# Patient Record
Sex: Female | Born: 1990 | Race: White | Hispanic: Yes | Marital: Single | Smoking: Former smoker
Health system: Southern US, Community
[De-identification: ages and names within clinical notes are randomized; demographics above are authoritative.]

## PROBLEM LIST (undated history)

## (undated) ENCOUNTER — Inpatient Hospital Stay: Payer: Self-pay

## (undated) DIAGNOSIS — O99212 Obesity complicating pregnancy, second trimester: Secondary | ICD-10-CM

## (undated) DIAGNOSIS — O039 Complete or unspecified spontaneous abortion without complication: Secondary | ICD-10-CM

## (undated) DIAGNOSIS — D649 Anemia, unspecified: Secondary | ICD-10-CM

## (undated) HISTORY — PX: WISDOM TOOTH EXTRACTION: SHX21

## (undated) HISTORY — PX: CHOLECYSTECTOMY: SHX55

## (undated) HISTORY — PX: CYSTECTOMY: SUR359

## (undated) HISTORY — DX: Complete or unspecified spontaneous abortion without complication: O03.9

## (undated) HISTORY — DX: Anemia, unspecified: D64.9

---
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
## 2009-07-09 ENCOUNTER — Emergency Department: Payer: Self-pay | Admitting: Emergency Medicine

## 2009-08-01 ENCOUNTER — Emergency Department: Payer: Self-pay | Admitting: Emergency Medicine

## 2010-09-09 ENCOUNTER — Emergency Department: Payer: Self-pay | Admitting: Emergency Medicine

## 2010-11-18 ENCOUNTER — Emergency Department: Payer: Self-pay | Admitting: Emergency Medicine

## 2011-01-10 ENCOUNTER — Observation Stay: Payer: Self-pay | Admitting: Obstetrics and Gynecology

## 2011-02-12 ENCOUNTER — Inpatient Hospital Stay: Payer: Self-pay

## 2012-04-13 ENCOUNTER — Emergency Department: Payer: Self-pay | Admitting: *Deleted

## 2012-04-13 LAB — URINALYSIS, COMPLETE
Glucose,UR: NEGATIVE mg/dL (ref 0–75)
Nitrite: NEGATIVE
Ph: 5 (ref 4.5–8.0)
Specific Gravity: 1.032 (ref 1.003–1.030)
WBC UR: 443 /HPF (ref 0–5)

## 2012-04-13 LAB — PREGNANCY, URINE: Pregnancy Test, Urine: NEGATIVE m[IU]/mL

## 2013-08-10 ENCOUNTER — Emergency Department: Payer: Self-pay | Admitting: Emergency Medicine

## 2013-08-10 LAB — CBC
HCT: 38.7 % (ref 35.0–47.0)
MCH: 29.8 pg (ref 26.0–34.0)
MCHC: 35 g/dL (ref 32.0–36.0)
MCV: 85 fL (ref 80–100)
WBC: 9.4 10*3/uL (ref 3.6–11.0)

## 2013-08-10 LAB — BASIC METABOLIC PANEL
Anion Gap: 6 — ABNORMAL LOW (ref 7–16)
Calcium, Total: 8.8 mg/dL (ref 8.5–10.1)
Chloride: 105 mmol/L (ref 98–107)
Co2: 27 mmol/L (ref 21–32)
Creatinine: 0.52 mg/dL — ABNORMAL LOW (ref 0.60–1.30)
EGFR (Non-African Amer.): >60
Osmolality: 275 (ref 275–301)
Potassium: 3.6 mmol/L (ref 3.5–5.1)

## 2013-08-10 LAB — TROPONIN I: Troponin-I: <0.02 ng/mL

## 2013-08-11 LAB — CK: CK, Total: 74 U/L (ref 21–215)

## 2013-10-23 ENCOUNTER — Emergency Department: Payer: Self-pay | Admitting: Emergency Medicine

## 2014-01-10 ENCOUNTER — Emergency Department: Payer: Self-pay | Admitting: Emergency Medicine

## 2014-03-07 ENCOUNTER — Emergency Department: Payer: Self-pay | Admitting: Emergency Medicine

## 2014-03-07 LAB — COMPREHENSIVE METABOLIC PANEL
ALBUMIN: 3.7 g/dL (ref 3.4–5.0)
ALK PHOS: 92 U/L
AST: 15 U/L (ref 15–37)
Anion Gap: 6 — ABNORMAL LOW (ref 7–16)
BILIRUBIN TOTAL: 0.3 mg/dL (ref 0.2–1.0)
BUN: 14 mg/dL (ref 7–18)
CO2: 27 mmol/L (ref 21–32)
CREATININE: 0.58 mg/dL — AB (ref 0.60–1.30)
Calcium, Total: 8.6 mg/dL (ref 8.5–10.1)
Chloride: 107 mmol/L (ref 98–107)
Glucose: 111 mg/dL — ABNORMAL HIGH (ref 65–99)
Osmolality: 281 (ref 275–301)
POTASSIUM: 3.7 mmol/L (ref 3.5–5.1)
SGPT (ALT): 25 U/L (ref 12–78)
Sodium: 140 mmol/L (ref 136–145)
TOTAL PROTEIN: 7.2 g/dL (ref 6.4–8.2)

## 2014-03-07 LAB — URINALYSIS, COMPLETE
BACTERIA: NONE SEEN
Bilirubin,UR: NEGATIVE
GLUCOSE, UR: NEGATIVE mg/dL (ref 0–75)
Ketone: NEGATIVE
Nitrite: NEGATIVE
PH: 6 (ref 4.5–8.0)
PROTEIN: NEGATIVE
RBC,UR: 1 /HPF (ref 0–5)
Specific Gravity: 1.024 (ref 1.003–1.030)

## 2014-03-07 LAB — CBC
HCT: 40.3 % (ref 35.0–47.0)
HGB: 13.9 g/dL (ref 12.0–16.0)
MCH: 30 pg (ref 26.0–34.0)
MCHC: 34.5 g/dL (ref 32.0–36.0)
MCV: 87 fL (ref 80–100)
Platelet: 347 10*3/uL (ref 150–440)
RBC: 4.63 10*6/uL (ref 3.80–5.20)
RDW: 13.8 % (ref 11.5–14.5)
WBC: 9.3 10*3/uL (ref 3.6–11.0)

## 2014-03-07 LAB — LIPASE, BLOOD: LIPASE: 125 U/L (ref 73–393)

## 2014-08-27 ENCOUNTER — Emergency Department: Payer: Self-pay | Admitting: Emergency Medicine

## 2014-09-11 DIAGNOSIS — O99212 Obesity complicating pregnancy, second trimester: Secondary | ICD-10-CM

## 2014-09-11 HISTORY — DX: Obesity complicating pregnancy, second trimester: O99.212

## 2014-09-21 ENCOUNTER — Ambulatory Visit: Payer: Self-pay | Admitting: Obstetrics and Gynecology

## 2014-09-21 LAB — COMPREHENSIVE METABOLIC PANEL
ALBUMIN: 3.5 g/dL (ref 3.4–5.0)
ANION GAP: 6 — AB (ref 7–16)
Alkaline Phosphatase: 97 U/L
BILIRUBIN TOTAL: 0.2 mg/dL (ref 0.2–1.0)
BUN: 13 mg/dL (ref 7–18)
Calcium, Total: 8.7 mg/dL (ref 8.5–10.1)
Chloride: 107 mmol/L (ref 98–107)
Co2: 27 mmol/L (ref 21–32)
Creatinine: 0.55 mg/dL — ABNORMAL LOW (ref 0.60–1.30)
EGFR (African American): >60
EGFR (Non-African Amer.): >60
Glucose: 107 mg/dL — ABNORMAL HIGH (ref 65–99)
Osmolality: 280 (ref 275–301)
POTASSIUM: 3.7 mmol/L (ref 3.5–5.1)
SGOT(AST): 18 U/L (ref 15–37)
SGPT (ALT): 24 U/L
SODIUM: 140 mmol/L (ref 136–145)
TOTAL PROTEIN: 6.8 g/dL (ref 6.4–8.2)

## 2014-09-21 LAB — PREGNANCY, URINE: PREGNANCY TEST, URINE: NEGATIVE m[IU]/mL

## 2014-09-24 ENCOUNTER — Ambulatory Visit: Payer: Self-pay | Admitting: Obstetrics and Gynecology

## 2014-11-04 ENCOUNTER — Emergency Department: Payer: Self-pay | Admitting: Emergency Medicine

## 2015-01-10 NOTE — Op Note (Signed)
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   POSTOPERATIVE DIAGNOSIS: Chronic Left Bartholin gland cyst.   PROCEDURE:  Bartholin gland marsupialization.   ANESTHESIA: General.   SURGEON: Thomasene MohairStephen Irisa Grimsley, MD.   ESTIMATED BLOOD LOSS: 30 mL.   OPERATIVE FLUIDS: 40 mL crystalloid.   COMPLICATIONS: None.   FINDINGS:  Left Bartholin gland cyst, 3 x 2 cm, no purulence to fluid.   SPECIMENS: None.   CONDITION AT END OF PROCEDURE: Stable.   PROCEDURE IN DETAIL: The patient was taken to the operating room where general anesthesia was administered and found to be adequate. The patient was placed in the dorsal supine high lithotomy position in candy cane stirrups and prepped and draped in the usual sterile fashion. After a timeout was called the left Bartholin gland cyst was identified and marked across the line of incision. A scalpel was used to open the overlying skin. The overlying skin was tagged with Allis clamps on either side of the cyst. The cyst wall was then opened and using 2-0 Vicryl interrupted sutures in a circumferential, interrupted fashion the epithelium of the cyst wall was sutured to the surrounding vulvar skin. Approximately 4 mL of 0.5% Sensorcaine plain was injected around the area of operation at the end of the procedure. Hemostasis was noted.   The patient tolerated the procedure well. Sponge, lap, and needle counts were correct x 2. For VTE prophylaxis the patient was wearing pneumatic compression stockings throughout the entire procedure. She was awakened in the operating room and taken to the recovery area in stable condition.    ____________________________ Conard NovakStephen D. Navya Timmons, MD sdj:bu D: 09/24/2014 09:56:50 ET T: 09/24/2014 13:14:23 ET JOB#: [id number]  cc: Conard NovakStephen D. [place] Doster, MD, <Dictator> Conard NovakSTEPHEN D Vi Whitesel MD ELECTRONICALLY SIGNED  10/14/2014 0:01

## 2015-02-18 ENCOUNTER — Emergency Department
Admission: EM | Admit: 2015-02-18 | Discharge: 2015-02-18 | Disposition: A | Discharge status: Home / Self Care | Payer: Self-pay | Attending: Emergency Medicine | Admitting: Emergency Medicine

## 2015-02-18 ENCOUNTER — Encounter: Payer: Self-pay | Admitting: Emergency Medicine

## 2015-02-18 DIAGNOSIS — M5441 Lumbago with sciatica, right side: Secondary | ICD-10-CM | POA: Insufficient documentation

## 2015-02-18 DIAGNOSIS — R1030 Lower abdominal pain, unspecified: Secondary | ICD-10-CM | POA: Insufficient documentation

## 2015-02-18 DIAGNOSIS — Z3202 Encounter for pregnancy test, result negative: Secondary | ICD-10-CM | POA: Insufficient documentation

## 2015-02-18 LAB — COMPREHENSIVE METABOLIC PANEL
ALT: 28 U/L (ref 14–54)
ANION GAP: 8 (ref 5–15)
AST: 23 U/L (ref 15–41)
Albumin: 4.3 g/dL (ref 3.5–5.0)
Alkaline Phosphatase: 102 U/L (ref 38–126)
BUN: 10 mg/dL (ref 6–20)
CALCIUM: 9.1 mg/dL (ref 8.9–10.3)
CO2: 26 mmol/L (ref 22–32)
CREATININE: 0.57 mg/dL (ref 0.44–1.00)
Chloride: 103 mmol/L (ref 101–111)
GFR calc Af Amer: >60 mL/min (ref >60)
Glucose, Bld: 89 mg/dL (ref 65–99)
Potassium: 3.5 mmol/L (ref 3.5–5.1)
Sodium: 137 mmol/L (ref 135–145)
Total Bilirubin: 0.4 mg/dL (ref 0.3–1.2)
Total Protein: 7.9 g/dL (ref 6.5–8.1)

## 2015-02-18 LAB — URINALYSIS COMPLETE WITH MICROSCOPIC (ARMC ONLY)
BACTERIA UA: NONE SEEN
Bilirubin Urine: NEGATIVE
Glucose, UA: NEGATIVE mg/dL
HGB URINE DIPSTICK: NEGATIVE
KETONES UR: NEGATIVE mg/dL
NITRITE: NEGATIVE
PROTEIN: 30 mg/dL — AB
Specific Gravity, Urine: 1.03 (ref 1.005–1.030)
pH: 6 (ref 5.0–8.0)

## 2015-02-18 LAB — CBC WITH DIFFERENTIAL/PLATELET
BASOS ABS: 0 10*3/uL (ref 0–0.1)
BASOS PCT: 1 %
Eosinophils Absolute: 0.2 10*3/uL (ref 0–0.7)
Eosinophils Relative: 2 %
HCT: 38.1 % (ref 35.0–47.0)
HEMOGLOBIN: 13.4 g/dL (ref 12.0–16.0)
Lymphocytes Relative: 17 %
Lymphs Abs: 1.7 10*3/uL (ref 1.0–3.6)
MCH: 30 pg (ref 26.0–34.0)
MCHC: 35.1 g/dL (ref 32.0–36.0)
MCV: 85.4 fL (ref 80.0–100.0)
Monocytes Absolute: 0.6 10*3/uL (ref 0.2–0.9)
Monocytes Relative: 6 %
NEUTROS ABS: 7.5 10*3/uL — AB (ref 1.4–6.5)
NEUTROS PCT: 74 %
Platelets: 339 10*3/uL (ref 150–440)
RBC: 4.46 MIL/uL (ref 3.80–5.20)
RDW: 13.9 % (ref 11.5–14.5)
WBC: 10 10*3/uL (ref 3.6–11.0)

## 2015-02-18 LAB — POCT PREGNANCY, URINE: PREG TEST UR: NEGATIVE

## 2015-02-18 MED ORDER — IBUPROFEN 600 MG PO TABS
ORAL_TABLET | ORAL | Status: AC
  Administered 2015-02-18: 600 mg via ORAL
  Filled 2015-02-18: qty 1

## 2015-02-18 MED ORDER — IBUPROFEN 400 MG PO TABS
600.0000 mg | ORAL_TABLET | Freq: Once | ORAL | Status: AC
  Administered 2015-02-18: 600 mg via ORAL

## 2015-02-18 MED ORDER — TRAMADOL HCL 50 MG PO TABS: 50.0000 mg | ORAL_TABLET | Freq: Four times a day (QID) | ORAL | Status: DC | PRN

## 2015-02-18 NOTE — ED Notes (Signed)
Pt comes in c/o lower abdominal pain that radiated into her lower back.  Patient states she had nausea 2 days ago when it started.  Denies vomited, diarrhea, fevers, and chills.  States she is having normal BM's and states she had these same symptoms as when she was diagnosed with gastritis a couple of years ago.

## 2015-02-18 NOTE — ED Provider Notes (Signed)
[facility] Emergency Department Provider Note  Time seen: 7:15 PM  I have reviewed the triage vital signs and the nursing notes.   HISTORY  Chief Complaint Abdominal Pain and Back Pain    HPI Madeline Todd is a 24 y.o. female with no medical history presents the emergency department with complaints of lower abdominal pain, as well as lower back pain which radiates to her right leg. Going to the patient for the past 2-3 days she has been experiencing lower back pain which radiates to her right leg, worse with movement, twisting, or bending over and standing up. He states also for the last 24 hours she has beenpacing lower abdominal pain. She denies any vaginal bleeding or discharge, denies dysuria, hematuria, nausea/vomiting/diarrhea. States normal bowel movements. Patient describes her lower abdominal pain as mild, dull/aching. Describes her lower back pain as moderate and sharp.     History reviewed. No pertinent past medical history.  There are no active problems to display for this patient.   Past Surgical History  Procedure Laterality Date  . Cystectomy      vaginal cyst    No current outpatient prescriptions on file.  Allergies Review of patient's allergies indicates no known allergies.  No family history on file.  Social History History  Substance Use Topics  . Smoking status: Never Smoker   . Smokeless tobacco: Not on file  . Alcohol Use: No    Review of Systems Constitutional: Negative for fever. Cardiovascular: Negative for chest pain. Respiratory: Negative for shortness of breath. Gastrointestinal: Positive for lower abdominal pain. Negative for nausea/vomiting/diarrhea. Genitourinary: Negative for dysuria. Negative for hematuria. Musculoskeletal: Positive for lower back pain. Neurological: Negative for headache  10-point ROS otherwise negative.  ____________________________________________   PHYSICAL  EXAM:  VITAL SIGNS: ED Triage Vitals  Enc Vitals Group     BP 02/18/15 1834 136/76 mmHg     Pulse Rate 02/18/15 1834 92     Resp 02/18/15 1834 18     Temp 02/18/15 1834 98.2 F (36.8 C)     Temp Source 02/18/15 1834 Oral     SpO2 02/18/15 1834 99 %     Weight 02/18/15 1834 236 lb (107.049 kg)     Height 02/18/15 1834 5\' 2"  (1.575 m)     Head Cir --      Peak Flow --      Pain Score 02/18/15 1835 6     Pain Loc --      Pain Edu? --      Excl. in GC? --     Constitutional: Alert and oriented. Well appearing and in no distress ENT   Head: Normocephalic and atraumatic.   Mouth/Throat: Mucous membranes are moist. Cardiovascular: Normal rate, regular rhythm. No murmur Respiratory: Normal respiratory effort without tachypnea nor retractions. Breath sounds are clear Gastrointestinal: Soft, mild suprapubic tenderness to palpation. No rebound or guarding. No CVA tenderness to palpation. Musculoskeletal: Moderate L-spine/paraspinal tenderness palpation. No CVA tenderness palpation. Neurologic:  Normal speech and language. No gross focal neurologic deficits Skin:  Skin is warm, dry and intact.  Psychiatric: Mood and affect are normal. Speech and behavior are normal.  ____________________________________________    INITIAL IMPRESSION / ASSESSMENT AND PLAN / ED COURSE  Pertinent labs & imaging results that were available during my care of the patient were reviewed by me and considered in my medical decision making (see chart for details).  Patient presents the emergency department with various complaints. Her back pain  appears most consistent with musculoskeletal pain, however patient denies any inciting event. 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 It is unclear the origin of the patient's abdominal pain at this time. She denies any vaginal bleeding or  discharge. Denies dysuria or hematuria. Patient appears to be tender in the suprapubic area. We will check labs and reevaluate. The patient has not taken any over-the-counter medications for relief, we will treat with Motrin all the away lab results.  Labs are largely within normal limits. We will discharge the patient home on Ultram as needed, and follow up with a primary care physician.  ____________________________________________   FINAL CLINICAL IMPRESSION(S) / ED DIAGNOSES  Lower back pain Lower abdominal pain   Minna Antis, MD 02/18/15 2009

## 2015-02-18 NOTE — ED Notes (Signed)
MD at bedside. 

## 2015-02-18 NOTE — Discharge Instructions (Signed)
Back Pain, Adult Back pain is very common. The pain often gets better over time. The cause of back pain is usually not dangerous. Most people can learn to manage their back pain on their own.  HOME CARE   Stay active. Start with short walks on flat ground if you can. Try to walk farther each day.  Do not sit, drive, or stand in one place for more than 30 minutes. Do not stay in bed.  Do not avoid exercise or work. Activity can help your back heal faster.  Be careful when you bend or lift an object. Bend at your knees, keep the object close to you, and do not twist.  Sleep on a firm mattress. Lie on your side, and bend your knees. If you lie on your back, put a pillow under your knees.  Only take medicines as told by your doctor.  Put ice on the injured area.  Put ice in a plastic bag.  Place a towel between your skin and the bag.  Leave the ice on for 15-20 minutes, 03-04 times a day for the first 2 to 3 days. After that, you can switch between ice and heat packs.  Ask your doctor about back exercises or massage.  Avoid feeling anxious or stressed. Find good ways to deal with stress, such as exercise. GET HELP RIGHT AWAY IF:   Your pain does not go away with rest or medicine.  Your pain does not go away in 1 week.  You have new problems.  You do not feel well.  The pain spreads into your legs.  You cannot control when you poop (bowel movement) or pee (urinate).  Your arms or legs feel weak or lose feeling (numbness).  You feel sick to your stomach (nauseous) or throw up (vomit).  You have belly (abdominal) pain.  You feel like you may pass out (faint). MAKE SURE YOU:   Understand these instructions.  Will watch your condition.  Will get help right away if you are not doing well or get worse. Document Released: 02/14/2008 Document Revised: 11/20/2011 Document Reviewed: 12/30/2013 [facility] Patient Information 2015 [place], [place]. This information is not intended  to replace advice given to you by your health care provider. Make sure you discuss any questions you have with your health care provider.    Please return to the emergency department for any worsening abdominal pain, fever, or worsening lower back pain.

## 2015-05-01 ENCOUNTER — Emergency Department
Admission: EM | Admit: 2015-05-01 | Discharge: 2015-05-01 | Disposition: A | Discharge status: Home / Self Care | Payer: BLUE CROSS/BLUE SHIELD | Attending: Emergency Medicine | Admitting: Emergency Medicine

## 2015-05-01 DIAGNOSIS — K029 Dental caries, unspecified: Secondary | ICD-10-CM | POA: Diagnosis not present

## 2015-05-01 DIAGNOSIS — K088 Other specified disorders of teeth and supporting structures: Secondary | ICD-10-CM | POA: Diagnosis present

## 2015-05-01 DIAGNOSIS — K047 Periapical abscess without sinus: Secondary | ICD-10-CM | POA: Diagnosis not present

## 2015-05-01 MED ORDER — LIDOCAINE-EPINEPHRINE 1 %-1:100000 IJ SOLN
1.3000 mL | Freq: Once | INTRAMUSCULAR | Status: AC
  Administered 2015-05-01: 1.3 mL

## 2015-05-01 MED ORDER — ONDANSETRON HCL 4 MG PO TABS: 4.0000 mg | ORAL_TABLET | Freq: Three times a day (TID) | ORAL | Status: DC | PRN

## 2015-05-01 MED ORDER — OXYCODONE-ACETAMINOPHEN 5-325 MG PO TABS: 1.0000 | ORAL_TABLET | Freq: Four times a day (QID) | ORAL | Status: DC | PRN

## 2015-05-01 NOTE — ED Provider Notes (Signed)
CSN: [id number]     Arrival date & time 05/01/15  1653 History   First MD Initiated Contact with Patient 05/01/15 1825     Chief Complaint  Patient presents with  . Dental Pain     (Consider location/radiation/quality/duration/timing/severity/associated sxs/prior Treatment) HPI  24 year old female presents to the emergency department for evaluation of right upper dental pain. Patient's pain began 2 weeks ago. She was initially treated at [facility] with amoxicillin 1 g 3 times a day. She is still on the antibiotic's. She saw a dentist last week who is going to schedule her for tooth extraction next week. She has tried taking oxycodone for pain but this causes nausea. She is only taken 3 tablets over the last 2 weeks. Her pain is moderate to severe at tooth #2. She denies any fevers, headaches, nausea, vomiting.  History reviewed. No pertinent past medical history. Past Surgical History  Procedure Laterality Date  . Cystectomy      vaginal cyst   No family history on file. Social History  Substance Use Topics  . Smoking status: Never Smoker   . Smokeless tobacco: None  . Alcohol Use: No   OB History    No data available     Review of Systems  Constitutional: Negative.  Negative for fever and chills.  HENT: Positive for dental problem and facial swelling. Negative for drooling, mouth sores, trouble swallowing and voice change.   Respiratory: Negative for chest tightness and shortness of breath.   Cardiovascular: Negative for chest pain.  Gastrointestinal: Negative for nausea, vomiting, abdominal pain and diarrhea.  Musculoskeletal: Negative for arthralgias, neck pain and neck stiffness.  Skin: Negative.   Psychiatric/Behavioral: Negative for confusion.  All other systems reviewed and are negative.     Allergies  Review of patient's allergies indicates no known allergies.  Home Medications   Prior to Admission medications   Medication Sig Start Date End Date Taking?  Authorizing Provider  ondansetron (ZOFRAN) 4 MG tablet Take 1 tablet (4 mg total) by mouth every 8 (eight) hours as needed for nausea or vomiting. 05/01/15   Evon Slack, PA-C  oxyCODONE-acetaminophen (ROXICET) 5-325 MG per tablet Take 1 tablet by mouth every 6 (six) hours as needed. 05/01/15 04/30/16  Evon Slack, PA-C  traMADol (ULTRAM) 50 MG tablet Take 1 tablet (50 mg total) by mouth every 6 (six) hours as needed. 02/18/15 02/18/16  Minna Antis, MD   BP 122/69 mmHg  Pulse 71  Temp(Src) 98.7 F (37.1 C) (Oral)  Resp 18  Ht 5' (1.524 m)  Wt 230 lb (104.327 kg)  BMI 44.92 kg/m2  SpO2 98% Physical Exam  Constitutional: She is oriented to person, place, and time. She appears well-developed and well-nourished. No distress.  HENT:  Head: Normocephalic and atraumatic.  Right Ear: External ear normal.  Left Ear: External ear normal.  Nose: Nose normal.  Mouth/Throat: Uvula is midline and oropharynx is clear and moist. No oral lesions. No trismus in the jaw. Normal dentition. Dental abscesses and dental caries present. No uvula swelling.    Eyes: EOM are normal.  Neck: Normal range of motion. Neck supple.  Cardiovascular: Normal rate.  Exam reveals no gallop and no friction rub.   No murmur heard. Pulmonary/Chest: Effort normal and breath sounds normal. No respiratory distress.  Neurological: She is alert and oriented to person, place, and time.  Skin: Skin is warm and dry.  Psychiatric: She has a normal mood and affect. Her behavior is normal. Thought content  normal.    ED Course  Procedures (including critical care time) Periodontal Exam  Dental Block :  Consent for operation or procedure: Risks and benefits discussed with patient and consent obtained  Lidocaine 2 ml with epi  The 2nd tooth was identified.  Lidocaine and epi was injected into that region.  A short time later adequate analgesia was obtained.  Estimated Blood Loss: minimal   Complications:  The patient  tolerated the procedure well without complications. Pain 0/10 after dental block       Labs Review Labs Reviewed - No data to display  Imaging Review No results found. I have personally reviewed and evaluated these images and lab results as part of my medical decision-making.   EKG Interpretation None      MDM   Final diagnoses:  Infected dental carries    24 year old female with infected upper tooth, #2. she is currently on antibiotic's. She has attempted taking pain medications, oxycodone, but this has caused nausea. Patient agreed and consented to a dental block. She will continue with oxycodone, amoxicillin. She is given a prescription for Zofran for nausea. Follow-up with dentist next week. Return to the ER for any fevers.    Evon Slack, PA-C 05/01/15 1905  Evon Slack, PA-C 05/01/15 1914  Loleta Rose, MD 05/02/15 0001

## 2015-05-01 NOTE — ED Notes (Addendum)
Pt reports being Rx's Amoxicillin for dental infection and Oxycodone/APAP 5-325MG  for pain which she states is no longer effective.

## 2015-05-01 NOTE — Discharge Instructions (Signed)
Dental Pain  A tooth ache may be caused by cavities (tooth decay). Cavities expose the nerve of the tooth to air and hot or cold temperatures. It may come from an infection or abscess (also called a boil or furuncle) around your tooth. It is also often caused by dental caries (tooth decay). This causes the pain you are having.  DIAGNOSIS   Your caregiver can diagnose this problem by exam.  TREATMENT   · If caused by an infection, it may be treated with medications which kill germs (antibiotics) and pain medications as prescribed by your caregiver. Take medications as directed.  · Only take over-the-counter or prescription medicines for pain, discomfort, or fever as directed by your caregiver.  · Whether the tooth ache today is caused by infection or dental disease, you should see your dentist as soon as possible for further care.  SEEK MEDICAL CARE IF:  The exam and treatment you received today has been provided on an emergency basis only. This is not a substitute for complete medical or dental care. If your problem worsens or new problems (symptoms) appear, and you are unable to meet with your dentist, call or return to this location.  SEEK IMMEDIATE MEDICAL CARE IF:   · You have a fever.  · You develop redness and swelling of your face, jaw, or neck.  · You are unable to open your mouth.  · You have severe pain uncontrolled by pain medicine.  MAKE SURE YOU:   · Understand these instructions.  · Will watch your condition.  · Will get help right away if you are not doing well or get worse.  Document Released: 08/28/2005 Document Revised: 11/20/2011 Document Reviewed: 04/15/2008  Exit[employer]® Patient Information ©2015 [employer]. This information is not intended to replace advice given to you by your health care provider. Make sure you discuss any questions you have with your health care provider.    Dental Caries  Dental caries is tooth decay. This decay can cause a hole in teeth (cavity) that can get bigger and  deeper over time.  HOME CARE  · Brush and floss your teeth. Do this at least two times a day.  · Use a fluoride toothpaste.  · Use a mouth rinse if told by your dentist or doctor.  · Eat less sugary and starchy foods. Drink less sugary drinks.  · Avoid snacking often on sugary and starchy foods. Avoid sipping often on sugary drinks.  · Keep regular checkups and cleanings with your dentist.  · Use fluoride supplements if told by your dentist or doctor.  · Allow fluoride to be applied to teeth if told by your dentist or doctor.  Document Released: 06/06/2008 Document Revised: 01/12/2014 Document Reviewed: 08/30/2012  [employer]® Patient Information ©2015 [employer]. This information is not intended to replace advice given to you by your health care provider. Make sure you discuss any questions you have with your health care provider.

## 2015-05-01 NOTE — ED Notes (Signed)
Pt c/o right upper toothache for the past 3 weeks, is currently on abx from dentist and they are going to pull the tooth when infection improves but she states pain is worse.

## 2015-05-23 ENCOUNTER — Emergency Department
Admission: EM | Admit: 2015-05-23 | Discharge: 2015-05-23 | Disposition: A | Discharge status: Home / Self Care | Payer: BLUE CROSS/BLUE SHIELD | Attending: Emergency Medicine | Admitting: Emergency Medicine

## 2015-05-23 ENCOUNTER — Emergency Department: Payer: BLUE CROSS/BLUE SHIELD

## 2015-05-23 DIAGNOSIS — O209 Hemorrhage in early pregnancy, unspecified: Secondary | ICD-10-CM | POA: Diagnosis present

## 2015-05-23 DIAGNOSIS — Z3A01 Less than 8 weeks gestation of pregnancy: Secondary | ICD-10-CM | POA: Diagnosis not present

## 2015-05-23 DIAGNOSIS — O200 Threatened abortion: Secondary | ICD-10-CM | POA: Diagnosis not present

## 2015-05-23 LAB — BASIC METABOLIC PANEL
Anion gap: 5 (ref 5–15)
BUN: 14 mg/dL (ref 6–20)
CO2: 25 mmol/L (ref 22–32)
CREATININE: 0.51 mg/dL (ref 0.44–1.00)
Calcium: 8.4 mg/dL — ABNORMAL LOW (ref 8.9–10.3)
Chloride: 108 mmol/L (ref 101–111)
GFR calc non Af Amer: >60 mL/min (ref >60)
Glucose, Bld: 94 mg/dL (ref 65–99)
Potassium: 3.7 mmol/L (ref 3.5–5.1)
SODIUM: 138 mmol/L (ref 135–145)

## 2015-05-23 LAB — URINALYSIS COMPLETE WITH MICROSCOPIC (ARMC ONLY)
Bilirubin Urine: NEGATIVE
Glucose, UA: NEGATIVE mg/dL
NITRITE: NEGATIVE
PROTEIN: 100 mg/dL — AB
Specific Gravity, Urine: 1.027 (ref 1.005–1.030)
pH: 7 (ref 5.0–8.0)

## 2015-05-23 LAB — CBC WITH DIFFERENTIAL/PLATELET
BASOS PCT: 0 %
Basophils Absolute: 0 10*3/uL (ref 0–0.1)
EOS ABS: 0.1 10*3/uL (ref 0–0.7)
Eosinophils Relative: 1 %
HCT: 37.1 % (ref 35.0–47.0)
Hemoglobin: 12.6 g/dL (ref 12.0–16.0)
Lymphocytes Relative: 14 %
Lymphs Abs: 1.1 10*3/uL (ref 1.0–3.6)
MCH: 29.6 pg (ref 26.0–34.0)
MCHC: 34.1 g/dL (ref 32.0–36.0)
MCV: 86.9 fL (ref 80.0–100.0)
MONO ABS: 0.5 10*3/uL (ref 0.2–0.9)
MONOS PCT: 6 %
Neutro Abs: 6 10*3/uL (ref 1.4–6.5)
Neutrophils Relative %: 79 %
Platelets: 261 10*3/uL (ref 150–440)
RBC: 4.27 MIL/uL (ref 3.80–5.20)
RDW: 14.2 % (ref 11.5–14.5)
WBC: 7.6 10*3/uL (ref 3.6–11.0)

## 2015-05-23 LAB — CHLAMYDIA/NGC RT PCR (ARMC ONLY)
CHLAMYDIA TR: NOT DETECTED
N gonorrhoeae: NOT DETECTED

## 2015-05-23 LAB — WET PREP, GENITAL
Clue Cells Wet Prep HPF POC: NONE SEEN
TRICH WET PREP: NONE SEEN
YEAST WET PREP: NONE SEEN

## 2015-05-23 LAB — ABO/RH: ABO/RH(D): A POS

## 2015-05-23 LAB — HCG, QUANTITATIVE, PREGNANCY: HCG, BETA CHAIN, QUANT, S: 26622 m[IU]/mL — AB (ref <5)

## 2015-05-23 NOTE — ED Notes (Signed)
Pt here with c/o awakening this am to bloody discharge  Pt describes discharge as burgundy red and thick  Reports two live births " I have never had symptoms like this with my other pregnancies."   3/10 pain reported to pelvic area

## 2015-05-23 NOTE — Discharge Instructions (Signed)
Please seek medical attention for any high fevers, chest pain, shortness of breath, change in behavior, persistent vomiting, bloody stool or any other new or concerning symptoms. ° ° °Threatened Miscarriage °A threatened miscarriage occurs when you have vaginal bleeding during your first 20 weeks of pregnancy but the pregnancy has not ended. If you have vaginal bleeding during this time, your health care provider will do tests to make sure you are still pregnant. If the tests show you are still pregnant and the developing baby (fetus) inside your womb (uterus) is still growing, your condition is considered a threatened miscarriage. °A threatened miscarriage does not mean your pregnancy will end, but it does increase the risk of losing your pregnancy (complete miscarriage). °CAUSES  °The cause of a threatened miscarriage is usually not known. If you go on to have a complete miscarriage, the most common cause is an abnormal number of chromosomes in the developing baby. Chromosomes are the structures inside cells that hold all your genetic material. °Some causes of vaginal bleeding that do not result in miscarriage include: °· Having sex. °· Having an infection. °· Normal hormone changes of pregnancy. °· Bleeding that occurs when an egg implants in your uterus. °RISK FACTORS °Risk factors for bleeding in early pregnancy include: °· Obesity. °· Smoking. °· Drinking excessive amounts of alcohol or caffeine. °· Recreational drug use. °SIGNS AND SYMPTOMS °· Light vaginal bleeding. °· Mild abdominal pain or cramps. °DIAGNOSIS  °If you have bleeding with or without abdominal pain before 20 weeks of pregnancy, your health care provider will do tests to check whether you are still pregnant. One important test involves using sound waves and a computer (ultrasound) to create images of the inside of your uterus. Other tests include an internal exam of your vagina and uterus (pelvic exam) and measurement of your baby's heart rate.   °You may be diagnosed with a threatened miscarriage if: °· Ultrasound testing shows you are still pregnant. °· Your baby's heart rate is strong. °· A pelvic exam shows that the opening between your uterus and your vagina (cervix) is closed. °· Your heart rate and blood pressure are stable. °· Blood tests confirm you are still pregnant. °TREATMENT  °No treatments have been shown to prevent a threatened miscarriage from going on to a complete miscarriage. However, the right home care is important.  °HOME CARE INSTRUCTIONS  °· Make sure you keep all your appointments for prenatal care. This is very important. °· Get plenty of rest. °· Do not have sex or use tampons if you have vaginal bleeding. °· Do not douche. °· Do not smoke or use recreational drugs. °· Do not drink alcohol. °· Avoid caffeine. °SEEK MEDICAL CARE IF: °· You have light vaginal bleeding or spotting while pregnant. °· You have abdominal pain or cramping. °· You have a fever. °SEEK IMMEDIATE MEDICAL CARE IF: °· You have heavy vaginal bleeding. °· You have blood clots coming from your vagina. °· You have severe low back pain or abdominal cramps. °· You have fever, chills, and severe abdominal pain. °MAKE SURE YOU: °· Understand these instructions. °· Will watch your condition. °· Will get help right away if you are not doing well or get worse. °Document Released: 08/28/2005 Document Revised: 09/02/2013 Document Reviewed: 06/24/2013 °Exit[employer]® Patient Information ©2015 [employer]. This information is not intended to replace advice given to you by your health care provider. Make sure you discuss any questions you have with your health care provider. ° °

## 2015-05-23 NOTE — ED Notes (Signed)
Patient transported to Ultrasound 

## 2015-05-23 NOTE — ED Provider Notes (Signed)
[facility] Emergency Department Provider Note   ____________________________________________  Time seen: 1115  I have reviewed the triage vital signs and the nursing notes.   HISTORY  Chief Complaint Vaginal Bleeding   History limited by: Not Limited   HPI Madeline Todd is a 24 y.o. female at roughly [redacted] weeks pregnant, who presents to the emergency department today because of concerns for vaginal bleeding and pelvic cramping. Patient states that she first noticed the blood upon awakening today. She describes it as a burgundy color. She states that she has had some associated lower abdominal/pelvic cramping. She has had 2 previous pregnancies without any complications. She denies any bleeding disorders. She denies any recent fevers, chest pains, shortness of breath, vomiting or diarrhea.   No past medical history on file.  There are no active problems to display for this patient.   Past Surgical History  Procedure Laterality Date  . Cystectomy      vaginal cyst    Current Outpatient Rx  Name  Route  Sig  Dispense  Refill  . ondansetron (ZOFRAN) 4 MG tablet   Oral   Take 1 tablet (4 mg total) by mouth every 8 (eight) hours as needed for nausea or vomiting.   30 tablet   0   . oxyCODONE-acetaminophen (ROXICET) 5-325 MG per tablet   Oral   Take 1 tablet by mouth every 6 (six) hours as needed.   20 tablet   0   . traMADol (ULTRAM) 50 MG tablet   Oral   Take 1 tablet (50 mg total) by mouth every 6 (six) hours as needed.   20 tablet   0     Allergies Review of patient's allergies indicates no known allergies.  No family history on file.  Social History Social History  Substance Use Topics  . Smoking status: Never Smoker   . Smokeless tobacco: Not on file  . Alcohol Use: No    Review of Systems  Constitutional: Negative for fever. Cardiovascular: Negative for chest pain. Respiratory: Negative for shortness of  breath. Gastrointestinal: Negative for abdominal pain, vomiting and diarrhea. Genitourinary: Negative for dysuria. Musculoskeletal: Negative for back pain. Skin: Negative for rash. Neurological: Negative for headaches, focal weakness or numbness.  10-point ROS otherwise negative.  ____________________________________________   PHYSICAL EXAM:  VITAL SIGNS: ED Triage Vitals  Enc Vitals Group     BP 05/23/15 1058 115/57 mmHg     Pulse Rate 05/23/15 1058 80     Resp --      Temp 05/23/15 1058 98.3 F (36.8 C)     Temp Source 05/23/15 1058 Oral     SpO2 05/23/15 1058 99 %     Weight 05/23/15 1058 234 lb (106.142 kg)     Height 05/23/15 1058 5\' 1"  (1.549 m)     Head Cir --      Peak Flow --      Pain Score 05/23/15 1100 3   Constitutional: Alert and oriented. Well appearing and in no distress. Eyes: Conjunctivae are normal. PERRL. Normal extraocular movements. ENT   Head: Normocephalic and atraumatic.   Nose: No congestion/rhinnorhea.   Mouth/Throat: Mucous membranes are moist.   Neck: No stridor. Hematological/Lymphatic/Immunilogical: No cervical lymphadenopathy. Cardiovascular: Normal rate, regular rhythm.  No murmurs, rubs, or gallops. Respiratory: Normal respiratory effort without tachypnea nor retractions. Breath sounds are clear and equal bilaterally. No wheezes/rales/rhonchi. Gastrointestinal: Soft and nontender. No distention. There is no CVA tenderness. Genitourinary: No external lesions. Blood  in vaginal vault. No CMT.  Musculoskeletal: Normal range of motion in all extremities. No joint effusions.  No lower extremity tenderness nor edema. Neurologic:  Normal speech and language. No gross focal neurologic deficits are appreciated. Speech is normal.  Skin:  Skin is warm, dry and intact. No rash noted. Psychiatric: Mood and affect are normal. Speech and behavior are normal. Patient exhibits appropriate insight and  judgment.  ____________________________________________    LABS (pertinent positives/negatives)  Labs Reviewed  WET PREP, GENITAL - Abnormal; Notable for the following:    WBC, Wet Prep HPF POC FEW (*)    All other components within normal limits  HCG, QUANTITATIVE, PREGNANCY - Abnormal; Notable for the following:    hCG, Beta Chain, [place] [address] (*)    All other components within normal limits  BASIC METABOLIC PANEL - Abnormal; Notable for the following:    Calcium 8.4 (*)    All other components within normal limits  URINALYSIS COMPLETEWITH MICROSCOPIC ([facility] ONLY) - Abnormal; Notable for the following:    Color, Urine YELLOW (*)    APPearance CLOUDY (*)    Ketones, ur TRACE (*)    Hgb urine dipstick 3+ (*)    Protein, ur 100 (*)    Leukocytes, UA 3+ (*)    Bacteria, UA FEW (*)    Squamous Epithelial / LPF 6-30 (*)    All other components within normal limits  CHLAMYDIA/NGC RT PCR ([facility] ONLY)  CBC WITH DIFFERENTIAL/PLATELET  ABO/RH     ____________________________________________   EKG  None  ____________________________________________    RADIOLOGY  US transvaginal IMPRESSION: Single live intrauterine gestation at 6 weeks 2 days.  Small subchorionic hemorrhage. The need for further follow-up can be determined on a clinical basis.  ____________________________________________   PROCEDURES  Procedure(s) performed: None  Critical Care performed: No  ____________________________________________   INITIAL IMPRESSION / ASSESSMENT AND PLAN / ED COURSE  Pertinent labs & imaging results that were available during my care of the patient were reviewed by me and considered in my medical decision making (see chart for details).  Patient presented to the emergency department with first trimester bleeding. Ultrasound does show a live single intrauterine gestation. Also mention a small subchorionic hemorrhage. I discussed this with the patient's  importance of following up with  OB/GYN. Patent RH positive. Encouraged continued prenatal vitamin use. Discussed return precautions.  ____________________________________________   FINAL CLINICAL IMPRESSION(S) / ED DIAGNOSES  Final diagnoses:  Threatened abortion     Phineas Semen, MD 05/23/15 1458

## 2015-06-18 LAB — OB RESULTS CONSOLE GC/CHLAMYDIA
CHLAMYDIA, DNA PROBE: NEGATIVE
GC PROBE AMP, GENITAL: NEGATIVE

## 2015-06-18 LAB — OB RESULTS CONSOLE HEPATITIS B SURFACE ANTIGEN: Hepatitis B Surface Ag: NEGATIVE

## 2015-06-18 LAB — OB RESULTS CONSOLE RPR: RPR: NONREACTIVE

## 2015-06-18 LAB — OB RESULTS CONSOLE HIV ANTIBODY (ROUTINE TESTING): HIV: NONREACTIVE

## 2015-07-02 ENCOUNTER — Encounter: Payer: Self-pay | Admitting: Adult Health

## 2015-07-02 DIAGNOSIS — O99211 Obesity complicating pregnancy, first trimester: Secondary | ICD-10-CM | POA: Insufficient documentation

## 2015-07-02 DIAGNOSIS — O210 Mild hyperemesis gravidarum: Secondary | ICD-10-CM | POA: Diagnosis not present

## 2015-07-02 DIAGNOSIS — O200 Threatened abortion: Secondary | ICD-10-CM | POA: Insufficient documentation

## 2015-07-02 DIAGNOSIS — Z79899 Other long term (current) drug therapy: Secondary | ICD-10-CM | POA: Insufficient documentation

## 2015-07-02 DIAGNOSIS — E669 Obesity, unspecified: Secondary | ICD-10-CM | POA: Insufficient documentation

## 2015-07-02 DIAGNOSIS — O209 Hemorrhage in early pregnancy, unspecified: Secondary | ICD-10-CM | POA: Diagnosis present

## 2015-07-02 DIAGNOSIS — Z3A12 12 weeks gestation of pregnancy: Secondary | ICD-10-CM | POA: Insufficient documentation

## 2015-07-02 NOTE — ED Notes (Signed)
Presents with upper abdominal discomfort began this am described as a stomach ache-pt urinated on hour ago and had blood on toilet paper after wiping-she is [redacted] weeks pregnant-denies discharge from vagina at this time and and bleeding since episode.

## 2015-07-03 ENCOUNTER — Emergency Department
Admission: EM | Admit: 2015-07-03 | Discharge: 2015-07-03 | Disposition: A | Discharge status: Home / Self Care | Payer: BLUE CROSS/BLUE SHIELD | Attending: Emergency Medicine | Admitting: Emergency Medicine

## 2015-07-03 ENCOUNTER — Emergency Department: Payer: BLUE CROSS/BLUE SHIELD

## 2015-07-03 DIAGNOSIS — R109 Unspecified abdominal pain: Secondary | ICD-10-CM

## 2015-07-03 DIAGNOSIS — R112 Nausea with vomiting, unspecified: Secondary | ICD-10-CM

## 2015-07-03 DIAGNOSIS — O200 Threatened abortion: Secondary | ICD-10-CM

## 2015-07-03 LAB — URINALYSIS COMPLETE WITH MICROSCOPIC (ARMC ONLY)
Bacteria, UA: NONE SEEN
Bilirubin Urine: NEGATIVE
GLUCOSE, UA: NEGATIVE mg/dL
Hgb urine dipstick: NEGATIVE
Ketones, ur: NEGATIVE mg/dL
NITRITE: NEGATIVE
PROTEIN: NEGATIVE mg/dL
RBC / HPF: NONE SEEN RBC/hpf (ref 0–5)
SPECIFIC GRAVITY, URINE: 1.017 (ref 1.005–1.030)
pH: 8 (ref 5.0–8.0)

## 2015-07-03 LAB — ABO/RH: ABO/RH(D): A POS

## 2015-07-03 LAB — HCG, QUANTITATIVE, PREGNANCY: hCG, Beta Chain, Quant, S: 95895 m[IU]/mL — ABNORMAL HIGH (ref <5)

## 2015-07-03 MED ORDER — ONDANSETRON HCL 4 MG PO TABS: ORAL_TABLET | ORAL | Status: DC

## 2015-07-03 NOTE — Discharge Instructions (Signed)
You have been seen in the Emergency Department (ED) for vaginal bleeding during pregnancy, which is called a threatened abortion.  Fortunately, your evaluation was reassuring, and the ultrasound showed a normal pregnancy.  Please start taking prenatal vitamins (over the counter) if you are not already doing so.  As a result of your blood type, you did not receive an injection of medication called Rhogam - please let your OB/Gyn know.  Please follow up as recommended above.  If you develop any other symptoms that concern you (including, but not limited to, persistent vomiting, worsening bleeding, abdominal or pelvic pain, or fever greater than 101), please return immediately to the Emergency Department.   Threatened Miscarriage A threatened miscarriage occurs when you have vaginal bleeding during your first 20 weeks of pregnancy but the pregnancy has not ended. If you have vaginal bleeding during this time, your health care provider will do tests to make sure you are still pregnant. If the tests show you are still pregnant and the developing baby (fetus) inside your womb (uterus) is still growing, your condition is considered a threatened miscarriage. A threatened miscarriage does not mean your pregnancy will end, but it does increase the risk of losing your pregnancy (complete miscarriage). CAUSES  The cause of a threatened miscarriage is usually not known. If you go on to have a complete miscarriage, the most common cause is an abnormal number of chromosomes in the developing baby. Chromosomes are the structures inside cells that hold all your genetic material. Some causes of vaginal bleeding that do not result in miscarriage include:  Having sex.  Having an infection.  Normal hormone changes of pregnancy.  Bleeding that occurs when an egg implants in your uterus. RISK FACTORS Risk factors for bleeding in early pregnancy include:  Obesity.  Smoking.  Drinking excessive amounts of  alcohol or caffeine.  Recreational drug use. SIGNS AND SYMPTOMS  Light vaginal bleeding.  Mild abdominal pain or cramps. DIAGNOSIS  If you have bleeding with or without abdominal pain before 20 weeks of pregnancy, your health care provider will do tests to check whether you are still pregnant. One important test involves using sound waves and a computer (ultrasound) to create images of the inside of your uterus. Other tests include an internal exam of your vagina and uterus (pelvic exam) and measurement of your baby's heart rate.  You may be diagnosed with a threatened miscarriage if:  Ultrasound testing shows you are still pregnant.  Your baby's heart rate is strong.  A pelvic exam shows that the opening between your uterus and your vagina (cervix) is closed.  Your heart rate and blood pressure are stable.  Blood tests confirm you are still pregnant. TREATMENT  No treatments have been shown to prevent a threatened miscarriage from going on to a complete miscarriage. However, the right home care is important.  HOME CARE INSTRUCTIONS   Make sure you keep all your appointments for prenatal care. This is very important.  Get plenty of rest.  Do not have sex or use tampons if you have vaginal bleeding.  Do not douche.  Do not smoke or use recreational drugs.  Do not drink alcohol.  Avoid caffeine. SEEK MEDICAL CARE IF:  You have light vaginal bleeding or spotting while pregnant.  You have abdominal pain or cramping.  You have a fever. SEEK IMMEDIATE MEDICAL CARE IF:  You have heavy vaginal bleeding.  You have blood clots coming from your vagina.  You have severe low back  pain or abdominal cramps.  You have fever, chills, and severe abdominal pain. MAKE SURE YOU:  Understand these instructions.  Will watch your condition.  Will get help right away if you are not doing well or get worse.   This information is not intended to replace advice given to you by your  health care provider. Make sure you discuss any questions you have with your health care provider.   Document Released: 08/28/2005 Document Revised: 09/02/2013 Document Reviewed: 06/24/2013 Elsevier Interactive Patient Education 2016 Else[employer] Inc[employer]  Nausea and Vomiting Nausea is a sick feeling that often comes before throwing up (vomiting). Vomiting is a reflex where stomach contents come out of your mouth. Vomiting can cause severe loss of body fluids (dehydration). Children and elderly adults can become dehydrated quickly, especially if they also have diarrhea. Nausea and vomiting are symptoms of a condition or disease. It is important to find the cause of your symptoms. CAUSES   Direct irritation of the stomach lining. This irritation can result from increased acid production (gastroesophageal reflux disease), infection, food poisoning, taking certain medicines (such as nonsteroidal anti-inflammatory drugs), alcohol use, or tobacco use.  Signals from the brain.These signals could be caused by a headache, heat exposure, an inner ear disturbance, increased pressure in the brain from injury, infection, a tumor, or a concussion, pain, emotional stimulus, or metabolic problems.  An obstruction in the gastrointestinal tract (bowel obstruction).  Illnesses such as diabetes, hepatitis, gallbladder problems, appendicitis, kidney problems, cancer, sepsis, atypical symptoms of a heart attack, or eating disorders.  Medical treatments such as chemotherapy and radiation.  Receiving medicine that makes you sleep (general anesthetic) during surgery. DIAGNOSIS Your caregiver may ask for tests to be done if the problems do not improve after a few days. Tests may also be done if symptoms are severe or if the reason for the nausea and vomiting is not clear. Tests may include:  Urine tests.  Blood tests.  Stool tests.  Cultures (to look for evidence of infection).  X-rays or other imaging  studies. Test results can help your caregiver make decisions about treatment or the need for additional tests. TREATMENT You need to stay well hydrated. Drink frequently but in small amounts.You may wish to drink water, sports drinks, clear broth, or eat frozen ice pops or gelatin dessert to help stay hydrated.When you eat, eating slowly may help prevent nausea.There are also some antinausea medicines that may help prevent nausea. HOME CARE INSTRUCTIONS   Take all medicine as directed by your caregiver.  If you do not have an appetite, do not force yourself to eat. However, you must continue to drink fluids.  If you have an appetite, eat a normal diet unless your caregiver tells you differently.  Eat a variety of complex carbohydrates (rice, wheat, potatoes, bread), lean meats, yogurt, fruits, and vegetables.  Avoid high-fat foods because they are more difficult to digest.  Drink enough water and fluids to keep your urine clear or pale yellow.  If you are dehydrated, ask your caregiver for specific rehydration instructions. Signs of dehydration may include:  Severe thirst.  Dry lips and mouth.  Dizziness.  Dark urine.  Decreasing urine frequency and amount.  Confusion.  Rapid breathing or pulse. SEEK IMMEDIATE MEDICAL CARE IF:   You have blood or brown flecks (like coffee grounds) in your vomit.  You have black or bloody stools.  You have a severe headache or stiff neck.  You are confused.  You have severe abdominal pain.  You have chest pain or trouble breathing.  You do not urinate at least once every 8 hours.  You develop cold or clammy skin.  You continue to vomit for longer than 24 to 48 hours.  You have a fever. MAKE SURE YOU:   Understand these instructions.  Will watch your condition.  Will get help right away if you are not doing well or get worse.   This information is not intended to replace advice given to you by your health care provider.  Make sure you discuss any questions you have with your health care provider.   Document Released: 08/28/2005 Document Revised: 11/20/2011 Document Reviewed: 01/25/2011 Elsevier Interactive Patient Education [employer]

## 2015-07-03 NOTE — ED Provider Notes (Signed)
[facility] Emergency Department Provider Note  ____________________________________________  Time seen: Approximately 1:41 AM  I have reviewed the triage vital signs and the nursing notes.   HISTORY  Chief Complaint Vaginal Bleeding    HPI Madeline Todd is a 24 y.o. female with past medical history that includes G3 P2 at [redacted] weeks gestation, prior threatened abortion this pregnancy, obesity,and he [facility] to the health department for prenatal care presents with generalized abdominal discomfort starting this morning.  She has had several episodes of vomiting.  She also noticed today when she went to the bathroom to urinate that she had blood on the toilet paper after wiping which she is had in the past, about 5 weeks ago.  She was worked up at that time and no acute issue is identified.  She has had no other complications with her pregnancy.  She describes the bleeding is minimal and only when she wipes.  She describes the pain as mild to moderate, waxing and waning in severity, a generalized ache throughout her abdomen with no specific focal tenderness, nothing makes it better or worse.  She has had several episodes of vomiting whenever she has tried to eat today but states that she thinks that it is worse because she has been worried about her blood sugar; she recently did the fasting glucose test at the health Department to determine if she has gestational diabetes and she has been anxious about this.   History reviewed. No pertinent past medical history.  There are no active problems to display for this patient.   Past Surgical History  Procedure Laterality Date  . Cystectomy      vaginal cyst    Current Outpatient Rx  Name  Route  Sig  Dispense  Refill  . ondansetron (ZOFRAN) 4 MG tablet      Take 1-2 tabs by mouth every 8 hours as needed for nausea/vomiting   30 tablet   0   . oxyCODONE-acetaminophen (ROXICET) 5-325 MG per tablet   Oral    Take 1 tablet by mouth every 6 (six) hours as needed.   20 tablet   0   . Prenatal Vit-Fe Fumarate-FA (PRENATAL MULTIVITAMIN) TABS tablet   Oral   Take 1 tablet by mouth at bedtime.         . traMADol (ULTRAM) 50 MG tablet   Oral   Take 1 tablet (50 mg total) by mouth every 6 (six) hours as needed.   20 tablet   0     Allergies Review of patient's allergies indicates no known allergies.  History reviewed. No pertinent family history.  Social History Social History  Substance Use Topics  . Smoking status: Never Smoker   . Smokeless tobacco: None  . Alcohol Use: No    Review of Systems Constitutional: No fever/chills Eyes: No visual changes. ENT: No sore throat. Cardiovascular: Denies chest pain. Respiratory: Denies shortness of breath. Gastrointestinal: Generalized aching abdominal pain.  Several episodes of vomiting today.  No diarrhea.  No constipation. Genitourinary: Negative for dysuria. Musculoskeletal: Negative for back pain. Skin: Negative for rash. Neurological: Negative for headaches, focal weakness or numbness.  10-point ROS otherwise negative.  ____________________________________________   PHYSICAL EXAM:  VITAL SIGNS: ED Triage Vitals  Enc Vitals Group     BP 07/02/15 2350 125/71 mmHg     Pulse Rate 07/02/15 2350 98     Resp 07/02/15 2350 16     Temp 07/02/15 2350 98.4 F (36.9 C)  Temp Source 07/02/15 2350 Oral     SpO2 07/02/15 2350 100 %     Weight 07/02/15 2350 225 lb (102.059 kg)     Height 07/02/15 2350  (1.549 m)     Head Cir --      Peak Flow --      Pain Score 07/02/15 2350 7     Pain Loc --      Pain Edu? --      Excl. in GC? --     Constitutional: Alert and oriented. Well appearing and in no acute distress. Eyes: Conjunctivae are normal. PERRL. EOMI. Head: Atraumatic. Nose: No congestion/rhinnorhea. Mouth/Throat: Mucous membranes are moist.  Oropharynx non-erythematous. Neck: No stridor.   Cardiovascular: Normal  rate, regular rhythm. Grossly normal heart sounds.  Good peripheral circulation. Respiratory: Normal respiratory effort.  No retractions. Lungs CTAB. Gastrointestinal: Obese.  Soft and nontender. No distention. No abdominal bruits. No CVA tenderness. Genitourinary: Deferred Musculoskeletal: No lower extremity tenderness nor edema.  No joint effusions. Neurologic:  Normal speech and language. No gross focal neurologic deficits are appreciated.  Skin:  Skin is warm, dry and intact. No rash noted. Psychiatric: Mood and affect are normal. Speech and behavior are normal.  ____________________________________________   LABS (all labs ordered are listed, but only abnormal results are displayed)  Labs Reviewed  HCG, QUANTITATIVE, PREGNANCY - Abnormal; Notable for the following:    hCG, Beta Chain, [place], S [address] (*)    All other components within normal limits  URINALYSIS COMPLETEWITH MICROSCOPIC ([facility] ONLY) - Abnormal; Notable for the following:    Color, Urine YELLOW (*)    APPearance TURBID (*)    Leukocytes, UA TRACE (*)    Squamous Epithelial / LPF 0-5 (*)    All other components within normal limits  ABO/RH   ____________________________________________  EKG  Not indicated ____________________________________________  RADIOLOGY   US Ob Comp Less 14 Wks  07/03/2015  CLINICAL DATA:  Acute onset of vaginal bleeding.  Initial encounter. EXAM: OBSTETRIC <14 WK ULTRASOUND TECHNIQUE: Transabdominal ultrasound was performed for evaluation of the gestation as well as the maternal uterus and adnexal regions. COMPARISON:  Pelvic ultrasound performed 05/23/2015 FINDINGS: Intrauterine gestational sac: Visualized/normal in shape. Yolk sac:  No Embryo:  Yes Cardiac Activity: Yes Heart Rate: 155 bpm CRL:   6.1 cm   12 w 4 d                  [place] EDC: 01/11/2016 Maternal uterus/adnexae: No subchorionic hemorrhage is noted. The uterus is otherwise unremarkable. The ovaries are unremarkable in  appearance. The right ovary measures 3.0 x 1.6 x 1.6 cm, while the left ovary measures 2.9 x 1.6 x 2.1 cm. No suspicious adnexal masses are seen; there is no evidence for ovarian torsion. No free fluid is seen within the pelvic cul-de-sac. IMPRESSION: Single live intrauterine pregnancy noted, with a crown-rump length of 6.1 cm, corresponding to a gestational age of [redacted] weeks 4 days. This matches the gestational age of [redacted] weeks 1 day by the prior ultrasound, reflecting an estimated date of delivery of Jan 14, 2016. Electronically Signed   By: Roanna Raider M.D.   On: 07/03/2015 05:32    ____________________________________________   PROCEDURES  Procedure(s) performed: None  Critical Care performed: No ____________________________________________   INITIAL IMPRESSION / ASSESSMENT AND PLAN / ED COURSE  Pertinent labs & imaging results that were available during my care of the patient were reviewed by me and considered in my medical  decision making (see chart for details).  The patient is well-appearing, has normal vital signs, and is in no acute distress.  She has no abdominal tenderness on my exam.  I will reevaluate her for threatened abortion although her symptoms are in general reassuring.  I suspect her nausea, vomiting, and generalized aching pain may be more a result of anxiety and worry over her pregnancy.  She agrees with this.  We will evaluate with ultrasound and reassess.  Her blood type is A+ so she does not need Rhogam.  ----------------------------------------- 6:00 AM on 07/03/2015 -----------------------------------------  The patient has been asymptomatic in the emergency department.  Her ultrasound results were unremarkable.  She states she feels much better and is ready to go home.  I gave her my usual customary return precautions.  ____________________________________________  FINAL CLINICAL IMPRESSION(S) / ED DIAGNOSES  Final diagnoses:  Threatened abortion   Abdominal pain, unspecified abdominal location  Non-intractable vomiting with nausea, vomiting of unspecified type      NEW MEDICATIONS STARTED DURING THIS VISIT:  New Prescriptions   ONDANSETRON (ZOFRAN) 4 MG TABLET    Take 1-2 tabs by mouth every 8 hours as needed for nausea/vomiting     Loleta Roseory Jamesia Linnen, MD 07/03/15 [phone]

## 2015-07-03 NOTE — ED Notes (Signed)
Pt in US

## 2015-08-19 ENCOUNTER — Other Ambulatory Visit: Payer: Self-pay | Admitting: Physician Assistant

## 2015-08-19 DIAGNOSIS — O99810 Abnormal glucose complicating pregnancy: Secondary | ICD-10-CM

## 2015-08-19 DIAGNOSIS — O0992 Supervision of high risk pregnancy, unspecified, second trimester: Secondary | ICD-10-CM

## 2015-08-20 ENCOUNTER — Emergency Department
Admission: EM | Admit: 2015-08-20 | Discharge: 2015-08-20 | Disposition: A | Discharge status: Home / Self Care | Payer: BLUE CROSS/BLUE SHIELD | Attending: Emergency Medicine | Admitting: Emergency Medicine

## 2015-08-20 ENCOUNTER — Emergency Department: Payer: BLUE CROSS/BLUE SHIELD

## 2015-08-20 ENCOUNTER — Encounter: Payer: Self-pay | Admitting: Emergency Medicine

## 2015-08-20 DIAGNOSIS — O2342 Unspecified infection of urinary tract in pregnancy, second trimester: Secondary | ICD-10-CM | POA: Diagnosis not present

## 2015-08-20 DIAGNOSIS — R112 Nausea with vomiting, unspecified: Secondary | ICD-10-CM

## 2015-08-20 DIAGNOSIS — O210 Mild hyperemesis gravidarum: Secondary | ICD-10-CM | POA: Diagnosis present

## 2015-08-20 DIAGNOSIS — N390 Urinary tract infection, site not specified: Secondary | ICD-10-CM

## 2015-08-20 DIAGNOSIS — Z3A19 19 weeks gestation of pregnancy: Secondary | ICD-10-CM | POA: Diagnosis not present

## 2015-08-20 DIAGNOSIS — Z79899 Other long term (current) drug therapy: Secondary | ICD-10-CM | POA: Insufficient documentation

## 2015-08-20 DIAGNOSIS — Z3490 Encounter for supervision of normal pregnancy, unspecified, unspecified trimester: Secondary | ICD-10-CM

## 2015-08-20 LAB — COMPREHENSIVE METABOLIC PANEL
ALBUMIN: 3.2 g/dL — AB (ref 3.5–5.0)
ALT: 16 U/L (ref 14–54)
ANION GAP: 7 (ref 5–15)
AST: 16 U/L (ref 15–41)
Alkaline Phosphatase: 88 U/L (ref 38–126)
BILIRUBIN TOTAL: 0.3 mg/dL (ref 0.3–1.2)
BUN: 7 mg/dL (ref 6–20)
CALCIUM: 8.4 mg/dL — AB (ref 8.9–10.3)
CO2: 22 mmol/L (ref 22–32)
Chloride: 108 mmol/L (ref 101–111)
Creatinine, Ser: <0.3 mg/dL — ABNORMAL LOW (ref 0.44–1.00)
GLUCOSE: 97 mg/dL (ref 65–99)
POTASSIUM: 3.3 mmol/L — AB (ref 3.5–5.1)
Sodium: 137 mmol/L (ref 135–145)
TOTAL PROTEIN: 6.5 g/dL (ref 6.5–8.1)

## 2015-08-20 LAB — CBC
HEMATOCRIT: 33 % — AB (ref 35.0–47.0)
HEMOGLOBIN: 11.4 g/dL — AB (ref 12.0–16.0)
MCH: 30.1 pg (ref 26.0–34.0)
MCHC: 34.7 g/dL (ref 32.0–36.0)
MCV: 86.9 fL (ref 80.0–100.0)
Platelets: 308 10*3/uL (ref 150–440)
RBC: 3.8 MIL/uL (ref 3.80–5.20)
RDW: 14.1 % (ref 11.5–14.5)
WBC: 8.6 10*3/uL (ref 3.6–11.0)

## 2015-08-20 LAB — URINALYSIS COMPLETE WITH MICROSCOPIC (ARMC ONLY)
BILIRUBIN URINE: NEGATIVE
Glucose, UA: 50 mg/dL — AB
Hgb urine dipstick: NEGATIVE
NITRITE: NEGATIVE
PH: 7 (ref 5.0–8.0)
PROTEIN: 30 mg/dL — AB
SPECIFIC GRAVITY, URINE: 1.025 (ref 1.005–1.030)

## 2015-08-20 LAB — LIPASE, BLOOD: Lipase: 20 U/L (ref 11–51)

## 2015-08-20 MED ORDER — NITROFURANTOIN MONOHYD MACRO 100 MG PO CAPS: 100.0000 mg | ORAL_CAPSULE | Freq: Two times a day (BID) | ORAL | Status: AC

## 2015-08-20 NOTE — ED Notes (Signed)
Pt reports upper abd pain,described as stabbing and cramping, vomiting x 2.  Pt states nausea started this AM.  Pt reports others at work sick with stomach bug.  Pt reports fetal movement during US.  Pt NAD at this time, respirations equal and unlabored, skin warm and dry.

## 2015-08-20 NOTE — Discharge Instructions (Signed)
Please seek medical attention for any high fevers, chest pain, shortness of breath, change in behavior, persistent vomiting, bloody stool or any other new or concerning symptoms. ° ° °Pregnancy and Urinary Tract Infection °A urinary tract infection (UTI) is a bacterial infection of the urinary tract. Infection of the urinary tract can include the ureters, kidneys (pyelonephritis), bladder (cystitis), and urethra (urethritis). All pregnant women should be screened for bacteria in the urinary tract. Identifying and treating a UTI will decrease the risk of preterm labor and developing more serious infections in both the mother and baby. °CAUSES °Bacteria germs cause almost all UTIs.  °RISK FACTORS °Many factors can increase your chances of getting a UTI during pregnancy. These include: °· Having a short urethra. °· Poor toilet and hygiene habits. °· Sexual intercourse. °· Blockage of urine along the urinary tract. °· Problems with the pelvic muscles or nerves. °· Diabetes. °· Obesity. °· Bladder problems after having several children. °· Previous history of UTI. °SIGNS AND SYMPTOMS  °· Pain, burning, or a stinging feeling when urinating. °· Suddenly feeling the need to urinate right away (urgency). °· Loss of bladder control (urinary incontinence). °· Frequent urination, more than is common with pregnancy. °· Lower abdominal or back discomfort. °· Cloudy urine. °· Blood in the urine (hematuria). °· Fever.  °When the kidneys are infected, the symptoms may be: °· Back pain. °· Flank pain on the right side more so than the left. °· Fever. °· Chills. °· Nausea. °· Vomiting. °DIAGNOSIS  °A urinary tract infection is usually diagnosed through urine tests. Additional tests and procedures are sometimes done. These may include: °· Ultrasound exam of the kidneys, ureters, bladder, and urethra. °· Looking in the bladder with a lighted tube (cystoscopy). °TREATMENT °Typically, UTIs can be treated with antibiotic medicines.  °HOME  CARE INSTRUCTIONS  °· Only take over-the-counter or prescription medicines as directed by your health care provider. If you were prescribed antibiotics, take them as directed. Finish them even if you start to feel better. °· Drink enough fluids to keep your urine clear or pale yellow. °· Do not have sexual intercourse until the infection is gone and your health care provider says it is okay. °· Make sure you are tested for UTIs throughout your pregnancy. These infections often come back.  °Preventing a UTI in the Future °· Practice good toilet habits. Always wipe from front to back. Use the tissue only once. °· Do not hold your urine. Empty your bladder as soon as possible when the urge comes. °· Do not douche or use deodorant sprays. °· Wash with soap and warm water around the genital area and the anus. °· Empty your bladder before and after sexual intercourse. °· Wear underwear with a cotton crotch. °· Avoid caffeine and carbonated drinks. They can irritate the bladder. °· Drink cranberry juice or take cranberry pills. This may decrease the risk of getting a UTI. °· Do not drink alcohol. °· Keep all your appointments and tests as scheduled.  °SEEK MEDICAL CARE IF:  °· Your symptoms get worse. °· You are still having fevers 2 or more days after treatment begins. °· You have a rash. °· You feel that you are having problems with medicines prescribed. °· You have abnormal vaginal discharge. °SEEK IMMEDIATE MEDICAL CARE IF:  °· You have back or flank pain. °· You have chills. °· You have blood in your urine. °· You have nausea and vomiting. °· You have contractions of your uterus. °· You have a gush of   fluid from the vagina. °MAKE SURE YOU: °· Understand these instructions.   °· Will watch your condition.   °· Will get help right away if you are not doing well or get worse.   °  °This information is not intended to replace advice given to you by your health care provider. Make sure you discuss any questions you have  with your health care provider. °  °Document Released: 12/23/2010 Document Revised: 06/18/2013 Document Reviewed: 03/27/2013 °Elsevier Interactive Patient Education ©2016 Else[employer] Inc[employer] ° °

## 2015-08-20 NOTE — ED Notes (Signed)
Pt to ultrasound

## 2015-08-20 NOTE — ED Provider Notes (Signed)
[facility] Emergency Department Provider Note   ____________________________________________  Time seen: 2145  I have reviewed the triage vital signs and the nursing notes.   HISTORY  Chief Complaint Abdominal Pain and Emesis During Pregnancy   History limited by: Not Limited   HPI Madeline Todd is a 24 y.o. female who presents to the emergency department today because of concerns for abdominal pain and vomiting. The patient states that she started feeling nauseous today and vomited multiple times. She then developed sharp abdominal pain. She states was located bilaterally in the upper abdomen. She states that this time the pain has subsided but she still feels sore. She states that her emesis was nonbloody did not had any dark green substance in it. She denies similar symptoms in the past. She denies any diarrhea. Denies any fevers.   History reviewed. No pertinent past medical history.  There are no active problems to display for this patient.   Past Surgical History  Procedure Laterality Date  . Cystectomy      vaginal cyst    Current Outpatient Rx  Name  Route  Sig  Dispense  Refill  . Prenatal Vit-Fe Fumarate-FA (PRENATAL MULTIVITAMIN) TABS tablet   Oral   Take 1 tablet by mouth at bedtime.         . ondansetron (ZOFRAN) 4 MG tablet      Take 1-2 tabs by mouth every 8 hours as needed for nausea/vomiting   30 tablet   0   . oxyCODONE-acetaminophen (ROXICET) 5-325 MG per tablet   Oral   Take 1 tablet by mouth every 6 (six) hours as needed.   20 tablet   0   . traMADol (ULTRAM) 50 MG tablet   Oral   Take 1 tablet (50 mg total) by mouth every 6 (six) hours as needed.   20 tablet   0     Allergies Review of patient's allergies indicates no known allergies.  History reviewed. No pertinent family history.  Social History Social History  Substance Use Topics  . Smoking status: Never Smoker   . Smokeless tobacco: None   . Alcohol Use: No    Review of Systems  Constitutional: Negative for fever. Cardiovascular: Negative for chest pain. Respiratory: Negative for shortness of breath. Gastrointestinal: Positive for upper bilateral abdominal pain and vomiting Genitourinary: Negative for dysuria. Musculoskeletal: Negative for back pain. Skin: Negative for rash. Neurological: Negative for headaches, focal weakness or numbness.  10-point ROS otherwise negative.  ____________________________________________   PHYSICAL EXAM:  VITAL SIGNS: ED Triage Vitals  Enc Vitals Group     BP 08/20/15 2012 115/53 mmHg     Pulse Rate 08/20/15 2012 94     Resp 08/20/15 2012 18     Temp 08/20/15 2012 97.9 F (36.6 C)     Temp Source 08/20/15 2012 Oral     SpO2 08/20/15 2012 98 %     Weight 08/20/15 2012 225 lb (102.059 kg)     Height 08/20/15 2012  (1.549 m)     Head Cir --      Peak Flow --      Pain Score 08/20/15 2012 9   Constitutional: Alert and oriented. Well appearing and in no distress. Eyes: Conjunctivae are normal. PERRL. Normal extraocular movements. ENT   Head: Normocephalic and atraumatic.   Nose: No congestion/rhinnorhea.   Mouth/Throat: Mucous membranes are moist.   Neck: No stridor. Hematological/Lymphatic/Immunilogical: No cervical lymphadenopathy. Cardiovascular: Normal rate, regular rhythm.  No murmurs, rubs, or gallops. Respiratory: Normal respiratory effort without tachypnea nor retractions. Breath sounds are clear and equal bilaterally. No wheezes/rales/rhonchi. Gastrointestinal: Soft and nontender. Gravid. No Murphy's point tenderness. Genitourinary: Deferred Musculoskeletal: Normal range of motion in all extremities. No joint effusions.  No lower extremity tenderness nor edema. Neurologic:  Normal speech and language. No gross focal neurologic deficits are appreciated.  Skin:  Skin is warm, dry and intact. No rash noted. Psychiatric: Mood and affect are normal.  Speech and behavior are normal. Patient exhibits appropriate insight and judgment.  ____________________________________________    LABS (pertinent positives/negatives)  Labs Reviewed  COMPREHENSIVE METABOLIC PANEL - Abnormal; Notable for the following:    Potassium 3.3 (*)    Creatinine, Ser <0.30 (*)    Calcium 8.4 (*)    Albumin 3.2 (*)    All other components within normal limits  CBC - Abnormal; Notable for the following:    Hemoglobin 11.4 (*)    HCT 33.0 (*)    All other components within normal limits  URINALYSIS COMPLETEWITH MICROSCOPIC ([facility] ONLY) - Abnormal; Notable for the following:    Color, Urine YELLOW (*)    APPearance CLEAR (*)    Glucose, UA 50 (*)    Ketones, ur TRACE (*)    Protein, ur 30 (*)    Leukocytes, UA 1+ (*)    Bacteria, UA RARE (*)    Squamous Epithelial / LPF 0-5 (*)    All other components within normal limits  LIPASE, BLOOD     ____________________________________________   EKG  None  ____________________________________________    RADIOLOGY  US ob/gyn IMPRESSION: Single live intrauterine pregnancy estimated gestational age [redacted] weeks 5 days for estimated date of delivery 01/14/2016.  ____________________________________________   PROCEDURES  Procedure(s) performed: None  Critical Care performed: No  ____________________________________________   INITIAL IMPRESSION / ASSESSMENT AND PLAN / ED COURSE  Pertinent labs & imaging results that were available during my care of the patient were reviewed by me and considered in my medical decision making (see chart for details).  Patient presented to the emergency department today because of concern for N/V and abdominal pain whilst pregnant. On my exam patient was already feeling better. Labwork showed possible UTI otherwise unremarkable. US without concerning findings. Will treat for UTI.  ____________________________________________   FINAL CLINICAL IMPRESSION(S) / ED  DIAGNOSES  Final diagnoses:  Pregnancy  Non-intractable vomiting with nausea, vomiting of unspecified type  UTI (lower urinary tract infection)     Phineas SemenGraydon Skye Rodarte, MD 08/21/15 1234

## 2015-08-20 NOTE — ED Notes (Signed)
Pt states approx 30 min pta felt upper abd pain with emesis. Pt is [redacted] weeks pregnant. Pt denies vaginal bleeding. Pt denies positive fetal movement since pain began.

## 2015-08-26 ENCOUNTER — Ambulatory Visit
Admission: RE | Admit: 2015-08-26 | Discharge: 2015-08-26 | Disposition: A | Discharge status: Home / Self Care | Payer: BLUE CROSS/BLUE SHIELD | Source: Ambulatory Visit | Attending: Maternal & Fetal Medicine | Admitting: Maternal & Fetal Medicine

## 2015-08-26 ENCOUNTER — Ambulatory Visit
Admission: RE | Admit: 2015-08-26 | Discharge: 2015-08-26 | Disposition: A | Discharge status: Home / Self Care | Payer: BLUE CROSS/BLUE SHIELD | Source: Ambulatory Visit | Attending: Physician Assistant | Admitting: Physician Assistant

## 2015-08-26 DIAGNOSIS — Z6841 Body Mass Index (BMI) 40.0 and over, adult: Secondary | ICD-10-CM | POA: Insufficient documentation

## 2015-08-26 DIAGNOSIS — O99210 Obesity complicating pregnancy, unspecified trimester: Secondary | ICD-10-CM

## 2015-08-26 DIAGNOSIS — E669 Obesity, unspecified: Secondary | ICD-10-CM | POA: Diagnosis not present

## 2015-08-26 DIAGNOSIS — O99212 Obesity complicating pregnancy, second trimester: Secondary | ICD-10-CM | POA: Insufficient documentation

## 2015-08-26 DIAGNOSIS — O99810 Abnormal glucose complicating pregnancy: Secondary | ICD-10-CM | POA: Diagnosis present

## 2015-08-26 DIAGNOSIS — Z3A20 20 weeks gestation of pregnancy: Secondary | ICD-10-CM | POA: Diagnosis not present

## 2015-08-26 DIAGNOSIS — O0992 Supervision of high risk pregnancy, unspecified, second trimester: Secondary | ICD-10-CM | POA: Insufficient documentation

## 2015-08-26 HISTORY — DX: Obesity complicating pregnancy, second trimester: O99.212

## 2015-08-26 NOTE — Progress Notes (Signed)
MFM CONSULT  24 yo G3P2002 with unsure LMP at 19/[redacted] weeks gestation [facility] and measurementsc/w 6w 2d (EDD 01/14/16) referred for consultation due to elevated BMI (43).  She had no complications  in her previous pregnancies and her children weighed over 9lbs at birth (no lacerations at delivery)  Past Medical History  Diagnosis Date  . Obesity affecting pregnancy in second trimester 2016   Past Surgical History  Procedure Laterality Date  . Cystectomy      vaginal cyst   Current Outpatient Prescriptions on File Prior to Encounter  Medication Sig Dispense Refill  . Prenatal Vit-Fe Fumarate-FA (PRENATAL MULTIVITAMIN) TABS tablet Take 1 tablet by mouth at bedtime.    . nitrofurantoin, macrocrystal-monohydrate, (MACROBID) 100 MG capsule Take 1 capsule (100 mg total) by mouth 2 (two) times daily. (Patient not taking: Reported on 08/26/2015) 14 capsule 0  . ondansetron (ZOFRAN) 4 MG tablet Take 1-2 tabs by mouth every 8 hours as needed for nausea/vomiting (Patient not taking: Reported on 08/26/2015) 30 tablet 0  . oxyCODONE-acetaminophen (ROXICET) 5-325 MG per tablet Take 1 tablet by mouth every 6 (six) hours as needed. (Patient not taking: Reported on 08/26/2015) 20 tablet 0  . traMADol (ULTRAM) 50 MG tablet Take 1 tablet (50 mg total) by mouth every 6 (six) hours as needed. (Patient not taking: Reported on 08/26/2015) 20 tablet 0   No current facility-administered medications on file prior to encounter.    No Known Allergies   Social History   Social History  . Marital Status: Single    Spouse Name: N/A  . Number of Children: N/A  . Years of Education: N/A   Occupational History  . Not on file.   Social History Main Topics  . Smoking status: Never Smoker   . Smokeless tobacco: Never Used  . Alcohol Use: No  . Drug Use: No  . Sexual Activity: Yes   Other Topics Concern  . Not on file   Social History Narrative   05/23/15 hgb 12.6 plt 261K Hep BsAg  negative RPR NR TSH .80 P: C ratio 71 mg/G HIV neg Blood type A positive Cr 0.5  1. BMI 40--we addressed concerns related to elevated BMI in pregnancy including higher risk for gestational diabetes, preeclampsia, and fetal macrosomia. She reports no sleep apnea symptoms.  She did not initiate low dose aspirin because she still had questions about the need for aspirin--and was worried it would affect recent dental extractions.  We discussed the benefits for preeclampsia risk reduction in women who are at higher risk (overweight).  We also addressed the safety of aspirin use even though she had the dental work earlier in pregnancy.  -She plans to initiate ASA  this evening.   -We addressed referral to nutritionist--she may benefit from a discussion with nutritionist -Weight gain of 15lb recommended -she had nl 3h GTT and understands repeat 3h GTT will be recommended at ~28w -Baseline liver function testing, TSH (normal) and p:c ratio(normal), cbc (normal platelets) -baseline ECG (if not already done) -Recommend follow up growth scan in the third trimester (scheduled) and antenatal testing weekly beginning at 36 weeks.  Recommend delivery no later than [redacted] weeks gestation (sooner if clinically indicated).

## 2015-08-31 ENCOUNTER — Ambulatory Visit
Admission: RE | Admit: 2015-08-31 | Discharge: 2015-08-31 | Disposition: A | Discharge status: Home / Self Care | Payer: BLUE CROSS/BLUE SHIELD | Source: Ambulatory Visit | Attending: Advanced Practice Midwife | Admitting: Advanced Practice Midwife

## 2015-08-31 DIAGNOSIS — O0992 Supervision of high risk pregnancy, unspecified, second trimester: Secondary | ICD-10-CM | POA: Diagnosis not present

## 2015-08-31 DIAGNOSIS — O99212 Obesity complicating pregnancy, second trimester: Secondary | ICD-10-CM | POA: Diagnosis present

## 2015-08-31 DIAGNOSIS — E669 Obesity, unspecified: Secondary | ICD-10-CM | POA: Insufficient documentation

## 2015-09-12 NOTE — L&D Delivery Note (Signed)
Delivery Note At 9:36 AM a viable and healthy female "Sharlet SalinaBenjamin" was delivered via Vaginal, Spontaneous Delivery (Presentation: Right Occiput Anterior).  APGAR: 9, 9; weight pending Placenta status: , Spontaneous and intact  Cord: no nuchal. Cord pH: not collected  Anesthesia: None  Episiotomy: None Lacerations: None Suture Repair:n/a Est. Blood Loss (mL):  200  Mom to postpartum.  Baby to Couplet care / Skin to Skin.  24yo [id number] at 38+5wks presented in active labor. She progressed to fully dilated without epidural, one dose of stadol and use of NO2. She delivered on her side a viable and vigorous female infant. Delayed cord clamping and skin to skin immediately initiated.   Christeen DouglasBEASLEY, Nawaal Alling 01/06/2016, 10:12 AM

## 2015-10-14 ENCOUNTER — Emergency Department
Admission: EM | Admit: 2015-10-14 | Discharge: 2015-10-14 | Disposition: A | Discharge status: Home / Self Care | Payer: BLUE CROSS/BLUE SHIELD | Attending: Emergency Medicine | Admitting: Emergency Medicine

## 2015-10-14 ENCOUNTER — Encounter: Payer: Self-pay | Admitting: Emergency Medicine

## 2015-10-14 DIAGNOSIS — O99513 Diseases of the respiratory system complicating pregnancy, third trimester: Secondary | ICD-10-CM | POA: Insufficient documentation

## 2015-10-14 DIAGNOSIS — Z79899 Other long term (current) drug therapy: Secondary | ICD-10-CM | POA: Insufficient documentation

## 2015-10-14 DIAGNOSIS — Z3A25 25 weeks gestation of pregnancy: Secondary | ICD-10-CM | POA: Diagnosis not present

## 2015-10-14 DIAGNOSIS — J069 Acute upper respiratory infection, unspecified: Secondary | ICD-10-CM

## 2015-10-14 NOTE — ED Notes (Signed)
States she is having nasal congestion..then developed a cough about 4 am.. Cough is non prod but does make her "gag".  Pos fever and body aches this am

## 2015-10-14 NOTE — ED Provider Notes (Signed)
[facility] Emergency Department Provider Note  ____________________________________________  Time seen: Approximately 8:48 AM  I have reviewed the triage vital signs and the nursing notes.   HISTORY  Chief Complaint URI   HPI Madeline Todd is a 25 y.o. female is here with nasal congestion and cough since approximately 4 AM. Patient states that a nonproductive cough but does make her gag. Patient also has had some sinus congestion and sneezing. She has not actually taken her temperature. Patient is [redacted] weeks pregnant and receiving her prenatal care at the health department. She has not called anyone at the health Department for any recommendations. She states she has not had a complications with her pregnancy and the baby is moving and she is no distress. She states at times she is felt feverish but has not actually taken her temperature. There is been no chills and she denies any urinary symptoms.   Past Medical History  Diagnosis Date  . Obesity affecting pregnancy in second trimester 2016    There are no active problems to display for this patient.   Past Surgical History  Procedure Laterality Date  . Cystectomy      vaginal cyst    Current Outpatient Rx  Name  Route  Sig  Dispense  Refill  . ondansetron (ZOFRAN) 4 MG tablet      Take 1-2 tabs by mouth every 8 hours as needed for nausea/vomiting Patient not taking: Reported on 08/26/2015   30 tablet   0   . oxyCODONE-acetaminophen (ROXICET) 5-325 MG per tablet   Oral   Take 1 tablet by mouth every 6 (six) hours as needed. Patient not taking: Reported on 08/26/2015   20 tablet   0   . Prenatal Vit-Fe Fumarate-FA (PRENATAL MULTIVITAMIN) TABS tablet   Oral   Take 1 tablet by mouth at bedtime.         . ranitidine (ZANTAC) 150 MG tablet   Oral   Take 150 mg by mouth daily.         . traMADol (ULTRAM) 50 MG tablet   Oral   Take 1 tablet (50 mg total) by mouth every 6 (six)  hours as needed. Patient not taking: Reported on 08/26/2015   20 tablet   0     Allergies Review of patient's allergies indicates no known allergies.  No family history on file.  Social History Social History  Substance Use Topics  . Smoking status: Never Smoker   . Smokeless tobacco: Never Used  . Alcohol Use: No    Review of Systems Constitutional: Questionable fever/no chills ENT: No sore throat. Nasal congestion positive Cardiovascular: Denies chest pain. Respiratory: Denies shortness of breath. Nonproductive cough. Gastrointestinal: No abdominal pain.  No nausea, no vomiting.  No diarrhea.   Genitourinary: Negative for dysuria. Skin: Negative for rash. Neurological: Negative for headaches, focal weakness or numbness.  10-point ROS otherwise negative.  ____________________________________________   PHYSICAL EXAM:  VITAL SIGNS: ED Triage Vitals  Enc Vitals Group     BP 10/14/15 0833 118/46 mmHg     Pulse Rate 10/14/15 0833 80     Resp 10/14/15 0833 18     Temp 10/14/15 0833 98.3 F (36.8 C)     Temp Source 10/14/15 0833 Oral     SpO2 10/14/15 0833 98 %     Weight 10/14/15 0833 228 lb (103.42 kg)     Height 10/14/15 0833 5' (1.524 m)     Head Cir --  Peak Flow --      Pain Score --      Pain Loc --      Pain Edu? --      Excl. in GC? --     Constitutional: Alert and oriented. Well appearing and in no acute distress. Eyes: Conjunctivae are normal. PERRL. EOMI. Head: Atraumatic. Nose: Mild congestion/mild rhinnorhea.   EACs and TMs are clear bilaterally. Mouth/Throat: Mucous membranes are moist.  Oropharynx non-erythematous. Neck: No stridor.   Hematological/Lymphatic/Immunilogical: No cervical lymphadenopathy. Cardiovascular: Normal rate, regular rhythm. Grossly normal heart sounds.  Good peripheral circulation. Respiratory: Normal respiratory effort.  No retractions. Lungs CTAB. Gastrointestinal: Soft and nontender. Bowel sounds present  4. Musculoskeletal: No lower extremity tenderness nor edema.  No joint effusions. Neurologic:  Normal speech and language. No gross focal neurologic deficits are appreciated. No gait instability. Skin:  Skin is warm, dry and intact. No rash noted. Psychiatric: Mood and affect are normal. Speech and behavior are normal.  ____________________________________________   LABS (all labs ordered are listed, but only abnormal results are displayed)  Labs Reviewed - No data to display  PROCEDURES  Procedure(s) performed: None  Critical Care performed: No  ____________________________________________   INITIAL IMPRESSION / ASSESSMENT AND PLAN / ED COURSE  Pertinent labs & imaging results that were available during my care of the patient were reviewed by me and considered in my medical decision making (see chart for details).  Patient was told she take limited amounts of Sudafed and to use saline nose spray as needed for nasal congestion. She is contacted the health Department for any further recommendations from her OB/GYN. ____________________________________________   FINAL CLINICAL IMPRESSION(S) / ED DIAGNOSES  Final diagnoses:  Acute upper respiratory infection      Tommi Rumps, PA-C 10/14/15 1518  Jennye Moccasin, MD 10/14/15 1623

## 2015-10-14 NOTE — ED Notes (Signed)
Pt here with c/o cough, sinus congestion, sneezing and fever for 3 days now, [redacted] weeks pregnant. States baby has been moving, no pregnancy complications. Pt appears in no distress, VSS.

## 2015-10-14 NOTE — Discharge Instructions (Signed)
Upper Respiratory Infection, Adult Most upper respiratory infections (URIs) are caused by a virus. A URI affects the nose, throat, and upper air passages. The most common type of URI is often called "the common cold." HOME CARE   Take medicines only as told by your doctor.  Gargle warm saltwater or take cough drops to comfort your throat as told by your doctor.  Use a warm mist humidifier or inhale steam from a shower to increase air moisture. This may make it easier to breathe.  Drink enough fluid to keep your pee (urine) clear or pale yellow.  Eat soups and other clear broths.  Have a healthy diet.  Rest as needed.  Go back to work when your fever is gone or your doctor says it is okay.  You may need to stay home longer to avoid giving your URI to others.  You can also wear a face mask and wash your hands often to prevent spread of the virus.  Use your inhaler more if you have asthma.  Do not use any tobacco products, including cigarettes, chewing tobacco, or electronic cigarettes. If you need help quitting, ask your doctor. GET HELP IF:  You are getting worse, not better.  Your symptoms are not helped by medicine.  You have chills.  You are getting more short of breath.  You have brown or red mucus.  You have yellow or brown discharge from your nose.  You have pain in your face, especially when you bend forward.  You have a fever.  You have puffy (swollen) neck glands.  You have pain while swallowing.  You have white areas in the back of your throat. GET HELP RIGHT AWAY IF:   You have very bad or constant:  Headache.  Ear pain.  Pain in your forehead, behind your eyes, and over your cheekbones (sinus pain).  Chest pain.  You have long-lasting (chronic) lung disease and any of the following:  Wheezing.  Long-lasting cough.  Coughing up blood.  A change in your usual mucus.  You have a stiff neck.  You have changes in  your:  Vision.  Hearing.  Thinking.  Mood. MAKE SURE YOU:   Understand these instructions.  Will watch your condition.  Will get help right away if you are not doing well or get worse.   This information is not intended to replace advice given to you by your health care provider. Make sure you discuss any questions you have with your health care provider.   Document Released: 02/14/2008 Document Revised: 01/12/2015 Document Reviewed: 12/03/2013 Elsevier Interactive Patient Education 2016 [employer].   You may use limited amounts of Sudafed as needed for congestion. Saline nose spray for nasal congestion and mucus. Increase fluids. Follow-up care doctors [facility] health Department if any continued problems.

## 2015-11-01 ENCOUNTER — Other Ambulatory Visit: Payer: Self-pay | Admitting: Obstetrics and Gynecology

## 2015-11-01 DIAGNOSIS — O99213 Obesity complicating pregnancy, third trimester: Secondary | ICD-10-CM

## 2015-11-02 ENCOUNTER — Observation Stay
Admission: EM | Admit: 2015-11-02 | Discharge: 2015-11-02 | Disposition: A | Discharge status: Home / Self Care | Payer: BLUE CROSS/BLUE SHIELD | Attending: Obstetrics and Gynecology | Admitting: Obstetrics and Gynecology

## 2015-11-02 ENCOUNTER — Encounter: Payer: Self-pay | Admitting: *Deleted

## 2015-11-02 DIAGNOSIS — O368130 Decreased fetal movements, third trimester, not applicable or unspecified: Principal | ICD-10-CM | POA: Insufficient documentation

## 2015-11-02 DIAGNOSIS — Z7982 Long term (current) use of aspirin: Secondary | ICD-10-CM | POA: Insufficient documentation

## 2015-11-02 DIAGNOSIS — O26893 Other specified pregnancy related conditions, third trimester: Secondary | ICD-10-CM | POA: Diagnosis not present

## 2015-11-02 DIAGNOSIS — Z6841 Body Mass Index (BMI) 40.0 and over, adult: Secondary | ICD-10-CM | POA: Diagnosis not present

## 2015-11-02 DIAGNOSIS — R112 Nausea with vomiting, unspecified: Secondary | ICD-10-CM | POA: Diagnosis not present

## 2015-11-02 DIAGNOSIS — O219 Vomiting of pregnancy, unspecified: Secondary | ICD-10-CM | POA: Diagnosis present

## 2015-11-02 DIAGNOSIS — O99213 Obesity complicating pregnancy, third trimester: Secondary | ICD-10-CM | POA: Insufficient documentation

## 2015-11-02 DIAGNOSIS — Z3A29 29 weeks gestation of pregnancy: Secondary | ICD-10-CM | POA: Insufficient documentation

## 2015-11-02 DIAGNOSIS — E669 Obesity, unspecified: Secondary | ICD-10-CM | POA: Insufficient documentation

## 2015-11-02 LAB — URINALYSIS COMPLETE WITH MICROSCOPIC (ARMC ONLY)
BACTERIA UA: NONE SEEN
Bilirubin Urine: NEGATIVE
Glucose, UA: NEGATIVE mg/dL
HGB URINE DIPSTICK: NEGATIVE
NITRITE: NEGATIVE
PROTEIN: 30 mg/dL — AB
SPECIFIC GRAVITY, URINE: 1.028 (ref 1.005–1.030)
pH: 6 (ref 5.0–8.0)

## 2015-11-02 MED ORDER — ONDANSETRON 4 MG PO TBDP
8.0000 mg | ORAL_TABLET | Freq: Once | ORAL | Status: AC
  Administered 2015-11-02: 8 mg via ORAL

## 2015-11-02 MED ORDER — ACETAMINOPHEN 500 MG PO TABS: 1000.0000 mg | ORAL_TABLET | Freq: Four times a day (QID) | ORAL | Status: DC | PRN

## 2015-11-02 MED ORDER — ONDANSETRON 4 MG PO TBDP: 4.0000 mg | ORAL_TABLET | Freq: Three times a day (TID) | ORAL | Status: DC | PRN

## 2015-11-02 MED ORDER — ONDANSETRON 4 MG PO TBDP
ORAL_TABLET | ORAL | Status: AC
  Administered 2015-11-02: 8 mg via ORAL
  Filled 2015-11-02: qty 2

## 2015-11-02 MED ORDER — FAMOTIDINE 20 MG PO TABS
20.0000 mg | ORAL_TABLET | Freq: Once | ORAL | Status: AC | PRN
  Administered 2015-11-02: 20 mg via ORAL
  Filled 2015-11-02: qty 1

## 2015-11-02 NOTE — H&P (Signed)
Obstetrics Admission History & Physical  11/02/2015 - 3:24 PM Primary OBGYN: [facility]  Chief Complaint: nausea and vomiting at around 11-1130am   History of Present Illness  25 y.o. [id number] @ [redacted]w[redacted]d , with the above CC. Pregnancy complicated by: BMI >40.  Ms. Karryn Kosinski states that starting at around 11-1130am today she started having nausea and vomiting. +sick contacts at home and was able to eat breakfast this morning prior to the incident but hasn't been able to keep down anything since. No fevers, chills, chest pain, SOB, VB, LOF or UCs. She did have decreased FM when she came into [facility] triage but currently the fetus is moving fine. Nothing in the vagina in the past 24-48hrs. She has been getting followed at [facility] and aside from her weight with normal glucose testing, her pregnancy is uncomplicated. Normal anatomy scan with just marginal cord insertion and no e/o previa.   Review of Systems: Otherwise, her 12 point review of systems is negative or as noted in the History of Present Illness. PMHx:  Past Medical History  Diagnosis Date  . Obesity affecting pregnancy in second trimester 2016   PSHx:  Past Surgical History  Procedure Laterality Date  . Cystectomy      vaginal cyst   Medications: PNV, iron, baby ASA  Allergies: shellfish? OBHx:  OB History  Gravida Para Term Preterm AB SAB TAB Ectopic Multiple Living  # Outcome Date GA Lbr Len/2nd Weight Sex Delivery Anes PTL Lv  3 Current           2 Term 02/12/11    Heide Scales  1 Term 10/21/06    Genella Mech   Y    Obstetric Comments  Largest was 9lbs 8oz              FHx: History reviewed. No pertinent family history. Soc Hx:  Social History   Social History  . Marital Status: Single    Spouse Name: N/A  . Number of Children: N/A  . Years of Education: N/A   Occupational History  . Not on file.   Social History Main Topics  . Smoking status: Never Smoker   . Smokeless tobacco: Never  Used  . Alcohol Use: No  . Drug Use: No  . Sexual Activity: Yes   Other Topics Concern  . Not on file   Social History Narrative   Works at [employer]    Objective    Current Vital Signs 24h Vital Sign Ranges  T 98.1 F (36.7 C) Temp  Avg: 98.1 F (36.7 C)  Min: 98.1 F (36.7 C)  Max: 98.1 F (36.7 C)  BP (!) 109/47 mmHg BP  Min: 109/47  Max: 109/47  HR (!) 107 Pulse  Avg: 107  Min: 107  Max: 107  RR 16 Resp  Avg: 16  Min: 16  Max: 16  SaO2    98/RA No Data Recorded       24 Hour I/O Current Shift I/O  Time Ins Outs       EFM: 135 baseline, +accels, no decels, mod variabiltiy  Toco: +irritability initially but nothing currently  General: Well nourished, well developed female in no acute distress.  Skin:  Warm and dry.  Cardiovascular: normal s1, s2. No MRGs Respiratory:  Clear to auscultation bilateral. Normal respiratory effort Abdomen: obese, nttp, nd Neuro/Psych:  Normal mood and affect.    Labs  none  Radiology none   Assessment & Plan   25 y.o. [id number] @ [redacted]w[redacted]d with possible viral gastroenteritis. Pt currently stable *IUP: rNST/category I with accels *Uterine irritability: will do SVE prior to discharge but low sx for PTL given s/s, so unless UCs become definite and/or s/s appear then can hold off on FFN.  *GI: zofran ODT and u/a ordered, then will do PO challenge *Dispo: pending SVE and PO challenge.   Cornelia Copa MD [facility]BGYN Pager [phone]

## 2015-11-02 NOTE — OB Triage Note (Signed)
Patient was at work and started vomiting 2 hours before coming to the hospital. Unable to "keep anything down". Complaints of decreased fetal movement.

## 2015-11-02 NOTE — Plan of Care (Signed)
Pt discharged home with d/c instructions and prescription

## 2015-11-02 NOTE — Discharge Summary (Signed)
OB Triage Discharge Summary   25 y.o. [id number] @ [redacted]w[redacted]d presenting for nausea and vomiting and decreased FM.  Fetal status reassuring. EFM showed rNST and uterine irritability. Triage course: zofran odt x 1 and pt felt much better and passed PO challenge. U/a c/w dehydration. SVE cl/long/high. Pt discharged to home, with work note for tomorrow (PRN) in the discharge instructions. Pt given PRN zofran, as well. Next PNV on 2/23 for MFM u/s at [facility] pending: none RTC 2/23 for [facility] MFM u/s Disposition: Home  [place] Bing, Montez Hageman MD [facility]  Pager: [phone]

## 2015-11-02 NOTE — Discharge Instructions (Signed)
Nausea and Vomiting °Nausea is a sick feeling that often comes before throwing up (vomiting). Vomiting is a reflex where stomach contents come out of your mouth. Vomiting can cause severe loss of body fluids (dehydration). Children and elderly adults can become dehydrated quickly, especially if they also have diarrhea. Nausea and vomiting are symptoms of a condition or disease. It is important to find the cause of your symptoms. °CAUSES  °· Direct irritation of the stomach lining. This irritation can result from increased acid production (gastroesophageal reflux disease), infection, food poisoning, taking certain medicines (such as nonsteroidal anti-inflammatory drugs), alcohol use, or tobacco use. °· Signals from the brain. These signals could be caused by a headache, heat exposure, an inner ear disturbance, increased pressure in the brain from injury, infection, a tumor, or a concussion, pain, emotional stimulus, or metabolic problems. °· An obstruction in the gastrointestinal tract (bowel obstruction). °· Illnesses such as diabetes, hepatitis, gallbladder problems, appendicitis, kidney problems, cancer, sepsis, atypical symptoms of a heart attack, or eating disorders. °· Medical treatments such as chemotherapy and radiation. °· Receiving medicine that makes you sleep (general anesthetic) during surgery. °DIAGNOSIS °Your caregiver may ask for tests to be done if the problems do not improve after a few days. Tests may also be done if symptoms are severe or if the reason for the nausea and vomiting is not clear. Tests may include: °· Urine tests. °· Blood tests. °· Stool tests. °· Cultures (to look for evidence of infection). °· X-rays or other imaging studies. °Test results can help your caregiver make decisions about treatment or the need for additional tests. °TREATMENT °You need to stay well hydrated. Drink frequently but in small amounts. You may wish to drink water, sports drinks, clear broth, or eat frozen  ice pops or gelatin dessert to help stay hydrated. When you eat, eating slowly may help prevent nausea. There are also some antinausea medicines that may help prevent nausea. °HOME CARE INSTRUCTIONS  °· Take all medicine as directed by your caregiver. °· If you do not have an appetite, do not force yourself to eat. However, you must continue to drink fluids. °· If you have an appetite, eat a normal diet unless your caregiver tells you differently. °¨ Eat a variety of complex carbohydrates (rice, wheat, potatoes, bread), lean meats, yogurt, fruits, and vegetables. °¨ Avoid high-fat foods because they are more difficult to digest. °· Drink enough water and fluids to keep your urine clear or pale yellow. °· If you are dehydrated, ask your caregiver for specific rehydration instructions. Signs of dehydration may include: °¨ Severe thirst. °¨ Dry lips and mouth. °¨ Dizziness. °¨ Dark urine. °¨ Decreasing urine frequency and amount. °¨ Confusion. °¨ Rapid breathing or pulse. °SEEK IMMEDIATE MEDICAL CARE IF:  °· You have blood or brown flecks (like coffee grounds) in your vomit. °· You have black or bloody stools. °· You have a severe headache or stiff neck. °· You are confused. °· You have severe abdominal pain. °· You have chest pain or trouble breathing. °· You do not urinate at least once every 8 hours. °· You develop cold or clammy skin. °· You continue to vomit for longer than 24 to 48 hours. °· You have a fever. °MAKE SURE YOU:  °· Understand these instructions. °· Will watch your condition. °· Will get help right away if you are not doing well or get worse. °  °This information is not intended to replace advice given to you by your health care provider. Make sure   you discuss any questions you have with your health care provider.   Document Released: 08/28/2005 Document Revised: 11/20/2011 Document Reviewed: 01/25/2011 Elsevier Interactive Patient Education 2016 [employer].   November 02, 2015  Patient:  Madeline Todd  Date of Birth: [birth year]  Date of Visit: 11/02/2015    To Whom It May Concern:  Pierina Schuknecht was seen and treated in our [facility] emergency department on 11/02/2015. Maysie Parkhill  may return to work on 11/04/2015. If you have any questions or concerns, please feel free to contact me.  Sincerely,  [place] Bing, Montez Hageman MD [facility]  Pager: [phone]

## 2015-11-02 NOTE — Plan of Care (Signed)
Pt taken off EFM per request from dr Vergie Living. Pt has tolerated  gingerale and saltine crackers without n/v. Will check cervix. If everything remains stable , will d/c ome with precautions

## 2015-11-02 NOTE — Progress Notes (Signed)
Pt states she feels better after receiving zofran. Ginger ale and saltine crackers given to pt. No n/v since arrival in [facility]

## 2015-11-04 ENCOUNTER — Ambulatory Visit
Admission: RE | Admit: 2015-11-04 | Discharge: 2015-11-04 | Disposition: A | Discharge status: Home / Self Care | Payer: BLUE CROSS/BLUE SHIELD | Source: Ambulatory Visit | Attending: Obstetrics and Gynecology | Admitting: Obstetrics and Gynecology

## 2015-11-04 DIAGNOSIS — O358XX0 Maternal care for other (suspected) fetal abnormality and damage, not applicable or unspecified: Secondary | ICD-10-CM | POA: Insufficient documentation

## 2015-11-04 DIAGNOSIS — Z3A29 29 weeks gestation of pregnancy: Secondary | ICD-10-CM | POA: Insufficient documentation

## 2015-11-04 DIAGNOSIS — O99213 Obesity complicating pregnancy, third trimester: Secondary | ICD-10-CM | POA: Insufficient documentation

## 2015-11-25 ENCOUNTER — Emergency Department
Admission: EM | Admit: 2015-11-25 | Discharge: 2015-11-25 | Disposition: A | Discharge status: Home / Self Care | Payer: BLUE CROSS/BLUE SHIELD | Attending: Emergency Medicine | Admitting: Emergency Medicine

## 2015-11-25 ENCOUNTER — Encounter: Payer: Self-pay | Admitting: Emergency Medicine

## 2015-11-25 DIAGNOSIS — Z7982 Long term (current) use of aspirin: Secondary | ICD-10-CM | POA: Insufficient documentation

## 2015-11-25 DIAGNOSIS — Z79899 Other long term (current) drug therapy: Secondary | ICD-10-CM | POA: Diagnosis not present

## 2015-11-25 DIAGNOSIS — O9989 Other specified diseases and conditions complicating pregnancy, childbirth and the puerperium: Secondary | ICD-10-CM | POA: Diagnosis present

## 2015-11-25 DIAGNOSIS — Z3A33 33 weeks gestation of pregnancy: Secondary | ICD-10-CM | POA: Diagnosis not present

## 2015-11-25 DIAGNOSIS — O99613 Diseases of the digestive system complicating pregnancy, third trimester: Secondary | ICD-10-CM | POA: Diagnosis not present

## 2015-11-25 DIAGNOSIS — K1120 Sialoadenitis, unspecified: Secondary | ICD-10-CM | POA: Insufficient documentation

## 2015-11-25 MED ORDER — AMOXICILLIN-POT CLAVULANATE 875-125 MG PO TABS: 1.0000 | ORAL_TABLET | Freq: Two times a day (BID) | ORAL | Status: AC

## 2015-11-25 MED ORDER — AMOXICILLIN-POT CLAVULANATE 875-125 MG PO TABS
1.0000 | ORAL_TABLET | Freq: Once | ORAL | Status: AC
  Administered 2015-11-25: 1 via ORAL
  Filled 2015-11-25: qty 1

## 2015-11-25 NOTE — ED Provider Notes (Signed)
[facility] Emergency Department Provider Note  ____________________________________________  Time seen: Approximately 9:14 PM  I have reviewed the triage vital signs and the nursing notes.   HISTORY  Chief Complaint Otalgia    HPI Madeline Todd is a 25 y.o. female who is [redacted] weeks pregnant and presents with pain in the left preauricular region. Has tried Tylenol without benefit. No dental pain. Pain radiates to the left ear. No fevers or chills. No nausea or vomiting.   Past Medical History  Diagnosis Date  . Obesity affecting pregnancy in second trimester 2016    Patient Active Problem List   Diagnosis Date Noted  . Nausea and vomiting during pregnancy 11/02/2015    Past Surgical History  Procedure Laterality Date  . Cystectomy      vaginal cyst    Current Outpatient Rx  Name  Route  Sig  Dispense  Refill  . amoxicillin-clavulanate (AUGMENTIN) 875-125 MG tablet   Oral   Take 1 tablet by mouth every 12 (twelve) hours.   14 tablet   0   . aspirin 81 MG tablet   Oral   Take 81 mg by mouth daily.         . ferrous sulfate 325 (65 FE) MG tablet   Oral   Take 325 mg by mouth 2 (two) times daily with a meal.         . ondansetron (ZOFRAN-ODT) 4 MG disintegrating tablet   Oral   Take 1 tablet (4 mg total) by mouth every 8 (eight) hours as needed for nausea or vomiting.   10 tablet   0   . Prenatal Vit-Fe Fumarate-FA (PRENATAL MULTIVITAMIN) TABS tablet   Oral   Take 1 tablet by mouth at bedtime.         . ranitidine (ZANTAC) 150 MG tablet   Oral   Take 150 mg by mouth daily. Reported on 11/04/2015           Allergies Shrimp  No family history on file.  Social History Social History  Substance Use Topics  . Smoking status: Never Smoker   . Smokeless tobacco: Never Used  . Alcohol Use: No    Review of Systems Constitutional: No fever/chills Eyes: No visual changes. ENT: No sore throat. Cardiovascular:  Denies chest pain. Respiratory: Denies shortness of breath. Gastrointestinal: No abdominal pain.  No nausea, no vomiting.  No diarrhea.  No constipation. Genitourinary: Negative for dysuria. Musculoskeletal: Negative for back pain. Skin: Negative for rash. Neurological: Negative for headaches, focal weakness or numbness. 10-point ROS otherwise negative.  ____________________________________________   PHYSICAL EXAM:  VITAL SIGNS: ED Triage Vitals  Enc Vitals Group     BP 11/25/15 1953 105/55 mmHg     Pulse Rate 11/25/15 1953 72     Resp 11/25/15 1953 18     Temp 11/25/15 1953 98.1 F (36.7 C)     Temp Source 11/25/15 1953 Oral     SpO2 11/25/15 1953 97 %     Weight 11/25/15 1953 224 lb (101.606 kg)     Height 11/25/15 1953 5\' 1"  (1.549 m)     Head Cir --      Peak Flow --      Pain Score 11/25/15 1955 8     Pain Loc --      Pain Edu? --      Excl. in GC? --     Constitutional: Alert and oriented. Well appearing and in no acute distress. Eyes:  Conjunctivae are normal. PERRL. EOMI. Ears:  Clear with normal landmarks. No erythema.She is tender in the preauricular region. Mild swelling. Head: Atraumatic. Nose: No congestion/rhinnorhea. Mouth/Throat: Mucous membranes are moist.  Oropharynx non-erythematous. No lesions. Neck:  Supple.  No adenopathy.  But tender in the tonsillar lymph node region and preauricular region. Cardiovascular: Normal rate, regular rhythm. Grossly normal heart sounds.  Good peripheral circulation. Respiratory: Normal respiratory effort.  No retractions. Lungs CTAB. Gastrointestinal: Soft and nontender. No distention. No abdominal bruits. No CVA tenderness. Musculoskeletal: Nml ROM of upper and lower extremity joints. Neurologic:  Normal speech and language. No gross focal neurologic deficits are appreciated. No gait instability. Skin:  Skin is warm, dry and intact. No rash noted. Psychiatric: Mood and affect are normal. Speech and behavior are  normal.  ____________________________________________   LABS (all labs ordered are listed, but only abnormal results are displayed)  Labs Reviewed - No data to display ____________________________________________  EKG   ____________________________________________  RADIOLOGY   ____________________________________________   PROCEDURES  Procedure(s) performed: None  Critical Care performed: No  ____________________________________________   INITIAL IMPRESSION / ASSESSMENT AND PLAN / ED COURSE  Pertinent labs & imaging results that were available during my care of the patient were reviewed by me and considered in my medical decision making (see chart for details).  25 year old female who is [redacted] weeks pregnant and presenting with left preauricular pain and swelling consistent with parotid gland inflammation. She is given Augmentin. Encouraged lemon drops and warm compresses. Can follow-up with ENT physician if not improving. She will return to the emergency room for any worsening symptoms. ____________________________________________   FINAL CLINICAL IMPRESSION(S) / ED DIAGNOSES  Final diagnoses:  Parotiditis      Ignacia Bayley, PA-C 11/25/15 2116  Myrna Blazer, MD 11/26/15 [id number]

## 2015-11-25 NOTE — ED Notes (Signed)
C/o left ear pain onset at 1500 today.  Pain radiating to left teeth.  Tylenol taken, but pain has worsened.

## 2015-11-25 NOTE — Discharge Instructions (Signed)
Parotitis Parotitis is soreness and inflammation of one or both parotid glands. The parotid glands produce saliva. They are located on each side of the face, below and in front of the earlobes. The saliva produced comes out of tiny openings (ducts) inside the cheeks. In most cases, parotitis goes away over time or with treatment. If your parotitis is caused by certain long-term (chronic) diseases, it may come back again.  CAUSES  Parotitis can be caused by:  Viral infections. Mumps is one viral infection that can cause parotitis.  Bacterial infections.  Blockage of the salivary ducts due to a salivary stone.  Narrowing of the salivary ducts.  Swelling of the salivary ducts.  Dehydration.  Autoimmune conditions, such as sarcoidosis or Sjogren syndrome.  Air from activities such as scuba diving, glass blowing, or playing an instrument (rare).  Human immunodeficiency virus (HIV) or acquired immunodeficiency syndrome (AIDS).  Tuberculosis. SIGNS AND SYMPTOMS   The ears may appear to be pushed up and out from their normal position.  Redness (erythema) of the skin over the parotid glands.  Pain and tenderness over the parotid glands.  Swelling in the parotid gland area.  Yellowish-white fluid (pus) coming from the ducts inside the cheeks.  Dry mouth.  Bad taste in the mouth. DIAGNOSIS  Your health care provider may determine that you have parotitis based on your symptoms and a physical exam. A sample of fluid may also be taken from the parotid gland and tested to find the cause of your infection. X-rays or computed tomography (CT) scans may be taken if your health care provider thinks you might have a salivary stone blocking your salivary duct. TREATMENT  Treatment varies depending upon the cause of your parotitis. If your parotitis is caused by mumps, no treatment is needed. The condition will go away on its own after 7 to 10 days. In other cases, treatment may  include:  Antibiotic medicine if your infection was caused by bacteria.  Pain medicines.  Gland massage.  Eating sour candy to increase your saliva production.  Removal of salivary stones. Your health care provider may flush stones out with fluids or remove them with tweezers.  Surgery to remove the parotid glands. HOME CARE INSTRUCTIONS   If you were prescribed an antibiotic medicine, finish it all even if you start to feel better.  Put warm compresses on the sore area.  Take medicines only as directed by your health care provider.  Drink enough fluids to keep your urine clear or pale yellow. SEEK IMMEDIATE MEDICAL CARE IF:   You have increasing pain or swelling that is not controlled with medicine.  You have a fever. MAKE SURE YOU:  Understand these instructions.  Will watch your condition.  Will get help right away if you are not doing well or get worse.   This information is not intended to replace advice given to you by your health care provider. Make sure you discuss any questions you have with your health care provider.   Document Released: 02/17/2002 Document Revised: 09/18/2014 Document Reviewed: 01/21/2015 Elsevier Interactive Patient Education 2016 [employer]   Take Antibiotics as directed. Use Tylenol as needed. Follow up with the ENT physician for any signs of worsening. Return to the emergency room for any concerns.

## 2015-12-02 ENCOUNTER — Ambulatory Visit
Admission: RE | Admit: 2015-12-02 | Discharge: 2015-12-02 | Disposition: A | Discharge status: Home / Self Care | Payer: BLUE CROSS/BLUE SHIELD | Source: Ambulatory Visit | Attending: Maternal & Fetal Medicine | Admitting: Maternal & Fetal Medicine

## 2015-12-02 ENCOUNTER — Ambulatory Visit: Payer: BLUE CROSS/BLUE SHIELD

## 2015-12-02 ENCOUNTER — Other Ambulatory Visit: Payer: Self-pay

## 2015-12-02 VITALS — BP 130/73 | HR 102 | Temp 98.6°F | Wt 226.6 lb

## 2015-12-02 DIAGNOSIS — Z36 Encounter for antenatal screening of mother: Secondary | ICD-10-CM | POA: Insufficient documentation

## 2015-12-02 DIAGNOSIS — Z3A33 33 weeks gestation of pregnancy: Secondary | ICD-10-CM | POA: Diagnosis not present

## 2015-12-02 DIAGNOSIS — N889 Noninflammatory disorder of cervix uteri, unspecified: Secondary | ICD-10-CM | POA: Insufficient documentation

## 2015-12-02 DIAGNOSIS — N2889 Other specified disorders of kidney and ureter: Secondary | ICD-10-CM

## 2015-12-02 DIAGNOSIS — O99210 Obesity complicating pregnancy, unspecified trimester: Secondary | ICD-10-CM | POA: Insufficient documentation

## 2015-12-02 DIAGNOSIS — O09893 Supervision of other high risk pregnancies, third trimester: Secondary | ICD-10-CM | POA: Diagnosis not present

## 2015-12-02 DIAGNOSIS — Z6841 Body Mass Index (BMI) 40.0 and over, adult: Secondary | ICD-10-CM | POA: Diagnosis not present

## 2015-12-30 ENCOUNTER — Ambulatory Visit
Admission: RE | Admit: 2015-12-30 | Discharge: 2015-12-30 | Disposition: A | Discharge status: Home / Self Care | Payer: BLUE CROSS/BLUE SHIELD | Source: Ambulatory Visit | Attending: Obstetrics & Gynecology | Admitting: Obstetrics & Gynecology

## 2015-12-30 VITALS — BP 125/57 | HR 86 | Temp 97.8°F | Resp 18

## 2015-12-30 DIAGNOSIS — O99210 Obesity complicating pregnancy, unspecified trimester: Secondary | ICD-10-CM

## 2015-12-30 DIAGNOSIS — E669 Obesity, unspecified: Secondary | ICD-10-CM | POA: Diagnosis not present

## 2015-12-30 DIAGNOSIS — Z36 Encounter for antenatal screening of mother: Secondary | ICD-10-CM | POA: Diagnosis present

## 2015-12-30 DIAGNOSIS — Z3A37 37 weeks gestation of pregnancy: Secondary | ICD-10-CM | POA: Diagnosis not present

## 2015-12-30 DIAGNOSIS — O99213 Obesity complicating pregnancy, third trimester: Secondary | ICD-10-CM | POA: Insufficient documentation

## 2015-12-30 NOTE — Progress Notes (Signed)
Lynette from [facility] called and asked that we perform NST.  Notified Dr. Leone BrandGrotegut. JYB

## 2016-01-06 ENCOUNTER — Inpatient Hospital Stay
Admission: EM | Admit: 2016-01-06 | Discharge: 2016-01-08 | DRG: 775 | Disposition: A | Discharge status: Home / Self Care | Payer: BLUE CROSS/BLUE SHIELD | Attending: Obstetrics and Gynecology | Admitting: Obstetrics and Gynecology

## 2016-01-06 ENCOUNTER — Ambulatory Visit: Payer: BLUE CROSS/BLUE SHIELD

## 2016-01-06 DIAGNOSIS — Z6841 Body Mass Index (BMI) 40.0 and over, adult: Secondary | ICD-10-CM

## 2016-01-06 DIAGNOSIS — O99214 Obesity complicating childbirth: Secondary | ICD-10-CM | POA: Diagnosis present

## 2016-01-06 DIAGNOSIS — E669 Obesity, unspecified: Secondary | ICD-10-CM | POA: Diagnosis present

## 2016-01-06 DIAGNOSIS — O99210 Obesity complicating pregnancy, unspecified trimester: Secondary | ICD-10-CM

## 2016-01-06 DIAGNOSIS — O99213 Obesity complicating pregnancy, third trimester: Secondary | ICD-10-CM | POA: Diagnosis present

## 2016-01-06 DIAGNOSIS — Z3A38 38 weeks gestation of pregnancy: Secondary | ICD-10-CM | POA: Diagnosis not present

## 2016-01-06 LAB — CBC
HCT: 32.1 % — ABNORMAL LOW (ref 35.0–47.0)
Hemoglobin: 10.8 g/dL — ABNORMAL LOW (ref 12.0–16.0)
MCH: 26.5 pg (ref 26.0–34.0)
MCHC: 33.5 g/dL (ref 32.0–36.0)
MCV: 79.1 fL — AB (ref 80.0–100.0)
PLATELETS: 330 10*3/uL (ref 150–440)
RBC: 4.06 MIL/uL (ref 3.80–5.20)
RDW: 15.9 % — ABNORMAL HIGH (ref 11.5–14.5)
WBC: 8.6 10*3/uL (ref 3.6–11.0)

## 2016-01-06 LAB — RAPID HIV SCREEN (HIV 1/2 AB+AG)
HIV 1/2 Antibodies: NONREACTIVE
HIV-1 P24 ANTIGEN - HIV24: NONREACTIVE

## 2016-01-06 LAB — TYPE AND SCREEN
ABO/RH(D): A POS
Antibody Screen: NEGATIVE

## 2016-01-06 MED ORDER — MEASLES, MUMPS & RUBELLA VAC ~~LOC~~ INJ
0.5000 mL | INJECTION | Freq: Once | SUBCUTANEOUS | Status: DC
  Filled 2016-01-06: qty 0.5

## 2016-01-06 MED ORDER — LIDOCAINE HCL (PF) 1 % IJ SOLN: 30.0000 mL | INTRAMUSCULAR | Status: DC | PRN

## 2016-01-06 MED ORDER — SODIUM CHLORIDE 0.9% FLUSH: 3.0000 mL | Freq: Two times a day (BID) | INTRAVENOUS | Status: DC

## 2016-01-06 MED ORDER — ONDANSETRON HCL 4 MG PO TABS
4.0000 mg | ORAL_TABLET | ORAL | Status: DC | PRN
  Administered 2016-01-07: 4 mg via ORAL
  Filled 2016-01-06: qty 1

## 2016-01-06 MED ORDER — CITRIC ACID-SODIUM CITRATE 334-500 MG/5ML PO SOLN: 30.0000 mL | ORAL | Status: DC | PRN

## 2016-01-06 MED ORDER — ACETAMINOPHEN 325 MG PO TABS: 650.0000 mg | ORAL_TABLET | ORAL | Status: DC | PRN

## 2016-01-06 MED ORDER — TETANUS-DIPHTH-ACELL PERTUSSIS 5-2.5-18.5 LF-MCG/0.5 IM SUSP: 0.5000 mL | Freq: Once | INTRAMUSCULAR | Status: DC

## 2016-01-06 MED ORDER — OXYTOCIN BOLUS FROM INFUSION: 500.0000 mL | INTRAVENOUS | Status: DC

## 2016-01-06 MED ORDER — ONDANSETRON HCL 4 MG/2ML IJ SOLN: 4.0000 mg | INTRAMUSCULAR | Status: DC | PRN

## 2016-01-06 MED ORDER — OXYTOCIN 10 UNIT/ML IJ SOLN
INTRAMUSCULAR | Status: AC
  Filled 2016-01-06: qty 2

## 2016-01-06 MED ORDER — AMMONIA AROMATIC IN INHA
RESPIRATORY_TRACT | Status: AC
  Filled 2016-01-06: qty 10

## 2016-01-06 MED ORDER — ONDANSETRON HCL 4 MG/2ML IJ SOLN: 4.0000 mg | Freq: Four times a day (QID) | INTRAMUSCULAR | Status: DC | PRN

## 2016-01-06 MED ORDER — ZOLPIDEM TARTRATE 5 MG PO TABS: 5.0000 mg | ORAL_TABLET | Freq: Every evening | ORAL | Status: DC | PRN

## 2016-01-06 MED ORDER — SODIUM CHLORIDE 0.9 % IV SOLN: 250.0000 mL | INTRAVENOUS | Status: DC | PRN

## 2016-01-06 MED ORDER — OXYCODONE-ACETAMINOPHEN 5-325 MG PO TABS: 1.0000 | ORAL_TABLET | ORAL | Status: DC | PRN

## 2016-01-06 MED ORDER — SODIUM CHLORIDE 0.9% FLUSH: 3.0000 mL | INTRAVENOUS | Status: DC | PRN

## 2016-01-06 MED ORDER — OXYTOCIN 40 UNITS IN LACTATED RINGERS INFUSION - SIMPLE MED
2.5000 [IU]/h | INTRAVENOUS | Status: DC
Start: 2016-01-06 — End: 2016-01-06
  Administered 2016-01-06: 39.96 [IU]/h via INTRAVENOUS
  Filled 2016-01-06 (×2): qty 1000

## 2016-01-06 MED ORDER — WITCH HAZEL-GLYCERIN EX PADS: 1.0000 "application " | MEDICATED_PAD | CUTANEOUS | Status: DC | PRN

## 2016-01-06 MED ORDER — FERROUS SULFATE 325 (65 FE) MG PO TABS
325.0000 mg | ORAL_TABLET | Freq: Two times a day (BID) | ORAL | Status: DC
  Administered 2016-01-06 – 2016-01-08 (×4): 325 mg via ORAL
  Filled 2016-01-06 (×4): qty 1

## 2016-01-06 MED ORDER — MISOPROSTOL 200 MCG PO TABS
ORAL_TABLET | ORAL | Status: AC
  Filled 2016-01-06: qty 4

## 2016-01-06 MED ORDER — DIPHENHYDRAMINE HCL 25 MG PO CAPS: 25.0000 mg | ORAL_CAPSULE | Freq: Four times a day (QID) | ORAL | Status: DC | PRN

## 2016-01-06 MED ORDER — BENZOCAINE-MENTHOL 20-0.5 % EX AERO: 1.0000 "application " | INHALATION_SPRAY | CUTANEOUS | Status: DC | PRN

## 2016-01-06 MED ORDER — DIBUCAINE 1 % RE OINT
1.0000 | TOPICAL_OINTMENT | RECTAL | Status: DC | PRN
Start: 2016-01-06 — End: 2016-01-08

## 2016-01-06 MED ORDER — LACTATED RINGERS IV SOLN
INTRAVENOUS | Status: DC
  Administered 2016-01-06: 06:00:00 via INTRAVENOUS

## 2016-01-06 MED ORDER — LACTATED RINGERS IV SOLN: 500.0000 mL | INTRAVENOUS | Status: DC | PRN

## 2016-01-06 MED ORDER — FLEET ENEMA 7-19 GM/118ML RE ENEM: 1.0000 | ENEMA | Freq: Every day | RECTAL | Status: DC | PRN

## 2016-01-06 MED ORDER — SENNOSIDES-DOCUSATE SODIUM 8.6-50 MG PO TABS
2.0000 | ORAL_TABLET | ORAL | Status: DC
  Administered 2016-01-06 – 2016-01-07 (×2): 2 via ORAL
  Filled 2016-01-06 (×2): qty 2

## 2016-01-06 MED ORDER — COCONUT OIL OIL: 1.0000 "application " | TOPICAL_OIL | Status: DC | PRN

## 2016-01-06 MED ORDER — OXYCODONE-ACETAMINOPHEN 5-325 MG PO TABS
1.0000 | ORAL_TABLET | Freq: Four times a day (QID) | ORAL | Status: DC | PRN
  Administered 2016-01-07: 2 via ORAL
  Filled 2016-01-06: qty 2

## 2016-01-06 MED ORDER — LIDOCAINE HCL (PF) 1 % IJ SOLN
INTRAMUSCULAR | Status: AC
  Filled 2016-01-06: qty 30

## 2016-01-06 MED ORDER — OXYCODONE-ACETAMINOPHEN 5-325 MG PO TABS: 2.0000 | ORAL_TABLET | ORAL | Status: DC | PRN

## 2016-01-06 MED ORDER — BUTORPHANOL TARTRATE 1 MG/ML IJ SOLN
1.0000 mg | INTRAMUSCULAR | Status: DC | PRN
  Administered 2016-01-06: 1 mg via INTRAVENOUS
  Filled 2016-01-06: qty 1

## 2016-01-06 MED ORDER — SIMETHICONE 80 MG PO CHEW: 80.0000 mg | CHEWABLE_TABLET | ORAL | Status: DC | PRN

## 2016-01-06 MED ORDER — IBUPROFEN 600 MG PO TABS
600.0000 mg | ORAL_TABLET | Freq: Four times a day (QID) | ORAL | Status: DC
  Administered 2016-01-06 – 2016-01-08 (×9): 600 mg via ORAL
  Filled 2016-01-06 (×9): qty 1

## 2016-01-06 MED ORDER — PRENATAL MULTIVITAMIN CH
1.0000 | ORAL_TABLET | Freq: Every day | ORAL | Status: DC
  Administered 2016-01-06 – 2016-01-08 (×3): 1 via ORAL
  Filled 2016-01-06 (×3): qty 1

## 2016-01-06 MED ORDER — BISACODYL 10 MG RE SUPP: 10.0000 mg | Freq: Every day | RECTAL | Status: DC | PRN

## 2016-01-06 NOTE — Discharge Summary (Signed)
Obstetric Discharge Summary Reason for Admission: onset of labor Prenatal Procedures: none Intrapartum Procedures: spontaneous vaginal delivery Postpartum Procedures: none Complications-Operative and Postpartum: none HEMOGLOBIN  Date Value Ref Range Status  01/06/2016 10.8* 12.0 - 16.0 g/dL Final   HGB  Date Value Ref Range Status  03/07/2014 13.9 12.0-16.0 g/dL Final   HCT  Date Value Ref Range Status  01/06/2016 32.1* 35.0 - 47.0 % Final  03/07/2014 40.3 35.0-47.0 % Final    Physical Exam:  General: alert and cooperative Lochia: appropriate Uterine Fundus: firm Incision: n/a DVT Evaluation: No evidence of DVT seen on physical exam.  Discharge Diagnoses: Term Pregnancy-delivered  Discharge Information: Date: 01/06/2016 Activity: pelvic rest Diet: routine Medications: Ibuprofen and Colace Condition: stable Instructions: refer to practice specific booklet Discharge to: [facility] or  [facility] 6 weeks for PP care . Pt desires Nexplanon    Newborn Data: Live born female Madeline Todd Birth Weight:   APGAR: 9, 9  Home with mother.  Madeline Todd, Madeline Todd 01/06/2016, 10:15 AM

## 2016-01-06 NOTE — H&P (Signed)
Obstetrics Admission History & Physical   Primary OBGYN: [facility]  Chief Complaint: labor  LMP: 04/12/15 EDD: 01/14/16  History of Present Illness  25 y.o. [id number] @ 6058w5d here with contractions all day yesterday, worsening overnight   Pregnancy complicated by: BMI >40. Left fetal renal pylectasis - now resolved   No fevers, chills, chest pain, SOB, VB, LOF or UCs.   Review of Systems: Otherwise, her 12 point review of systems is negative or as noted in the History of Present Illness. PMHx:  Past Medical History  Diagnosis Date  . Obesity affecting pregnancy in second trimester 2016   PSHx:  Past Surgical History  Procedure Laterality Date  . Cystectomy      vaginal cyst   Medications: PNV, iron, baby ASA  Allergies: shellfish? OBHx:  OB History  Gravida Para Term Preterm AB SAB TAB Ectopic Multiple Living  3 2 2       2     # Outcome Date GA Lbr Len/2nd Weight Sex Delivery Anes PTL Lv  3 Current           2 Term 02/12/11    Heide ScalesM Vag-Spont   Y  1 Term 10/21/06    Genella MechM Vag-Spont   Y    Obstetric Comments  Largest was 9lbs 8oz     FHx: History reviewed. No pertinent family history. Soc Hx:  Social History   Social History  . Marital Status: Single    Spouse Name: N/A  . Number of Children: N/A  . Years of Education: N/A   Occupational History  . Not on file.   Social History Main Topics  . Smoking status: Never Smoker   . Smokeless tobacco: Never Used  . Alcohol Use: No  . Drug Use: No  . Sexual Activity: Yes   Other Topics Concern  . Not on file   Social History Narrative   Works at [employer]    Objective   Filed Vitals:   01/06/16 0557 01/06/16 0644  BP: 146/83 136/91  Pulse: 76 84  Temp:    Resp:                                           EFM: 140 baseline, +accels, no decels, mod variabiltiy   Toco: Q822min  General: Well nourished, well developed female in no acute distress.  Skin: Warm and dry.  Cardiovascular: normal s1, s2. No MRGs Respiratory: Clear to auscultation bilateral. Normal respiratory effort Abdomen: obese, nttp, nd Neuro/Psych: Normal mood and affect.   SVE per RN: 4-5/50/post, blood show  Labs: Awaiting from [facility]  Assessment & Plan  25 y.o. [id number] @ 7651w6d in labor. Will admit. - cont EFM, toco - stadol PRN, patient does not want epidural - GBS unknown, await records prior to [facility] - anticipate NSVD

## 2016-01-06 NOTE — OB Triage Note (Signed)
[facility], states she has been having contractions all afternoon yesterday, unable to sleep much during the night, this morning, contractions felt stronger and closer together, says she recently had 3 contractions in 10 mins. confirms +FM, spotting noticed yesterday afternoon, had intercourse, then spotting resolved. Pt denies heavy vaginal bleeding or leaking fluid, urinary symptoms, unusual vaginal discharge, nausea or vomiting. Seen in OB clinic last Thurs, had ultrasound done, cephalic.

## 2016-01-06 NOTE — Lactation Note (Signed)
This note was copied from a baby's chart. Lactation Consultation Note  Patient Name: Madeline Shona SimpsonVeronica Arias Todd Today's Date: 01/06/2016 Reason for consult: Initial assessment   Maternal Data Has patient been taught Hand Expression?: Yes  Feeding Feeding Type: Breast Fed  LATCH Score/Interventions Latch: Grasps breast easily, tongue down, lips flanged, rhythmical sucking.  Audible Swallowing: A few with stimulation  Type of Nipple: Everted at rest and after stimulation  Comfort (Breast/Nipple): Soft / non-tender     Hold (Positioning): Assistance needed to correctly position infant at breast and maintain latch. Intervention(s): Breastfeeding basics reviewed;Support Pillows;Position options;Skin to skin  LATCH Score: 8  Lactation Tools Discussed/Used     Consult Status      Trudee GripCarolyn P Jamoni Hewes 01/06/2016, 11:46 AM

## 2016-01-07 LAB — CBC
HCT: 28.8 % — ABNORMAL LOW (ref 35.0–47.0)
Hemoglobin: 9.4 g/dL — ABNORMAL LOW (ref 12.0–16.0)
MCH: 26.5 pg (ref 26.0–34.0)
MCHC: 32.7 g/dL (ref 32.0–36.0)
MCV: 81.1 fL (ref 80.0–100.0)
Platelets: 256 10*3/uL (ref 150–440)
RBC: 3.55 MIL/uL — AB (ref 3.80–5.20)
RDW: 16.4 % — ABNORMAL HIGH (ref 11.5–14.5)
WBC: 10 10*3/uL (ref 3.6–11.0)

## 2016-01-07 LAB — RPR: RPR Ser Ql: NONREACTIVE

## 2016-01-07 NOTE — Progress Notes (Signed)
Obstetric Discharge Summary   Patient ID: Madeline SimpsonVeronica Arias Todd MRN: [id number] DOB/AGE: 01-21-1991 25 y.o.   Date of Admission: 01/06/2016  Date of Discharge: 4/ 29/17  Admitting Diagnosis:  at [id number]  Secondary Diagnosis: Type II Diabetes mellitus and none  Mode of Delivery: normal spontaneous vaginal delivery     Discharge Diagnosis: NSVD of viable infant   Intrapartum Procedures: NOne   Post partum procedures: none  Complications: none   Brief Hospital Course  Madeline SimpsonVeronica Arias Todd is a [id number] who had a SVD on 01/06/16;  for further details of this delivery, please refer to the delivery note.  Patient had an uncomplicated postpartum course.  By time of discharge on PPD#2, her pain was controlled on oral pain medications; she had appropriate lochia and was ambulating, voiding without difficulty and tolerating regular diet.  She was deemed stable for discharge to home.    .    Labs: CBC Latest Ref Rng 01/07/2016 01/06/2016 08/20/2015  WBC 3.6 - 11.0 K/uL 10.0 8.6 8.6  Hemoglobin 12.0 - 16.0 g/dL 2.9(F9.4(L) 10.8(L) 11.4(L)  Hematocrit 35.0 - 47.0 % 28.8(L) 32.1(L) 33.0(L)  Platelets 150 - 440 K/uL 256 330 308   A POS  Physical exam:  Blood pressure 120/63, pulse 76, temperature 98.4 F (36.9 C), temperature source Oral, resp. rate 18, height 5' 1.2" (1.554 m), weight 228 lb (103.42 kg), last menstrual period 04/12/2015, SpO2 99 %, unknown if currently breastfeeding. General: alert and no distress Lungs: CTA bilat, No W/R/R. Heart: S1S2,RRR, No M/R/G.  Lochia: appropriate Abdomen: soft, NT Uterine Fundus: firm Extremities: No evidence of DVT seen on physical exam. No lower extremity edema.  Discharge Instructions: Per After Visit Summary. Activity: Advance as tolerated. Pelvic rest for 6 weeks.  Also refer to After Visit Summary Diet: Regular Medications:   Medication List    ASK your doctor about these medications        aspirin 81 MG tablet  Take 81 mg by mouth  daily. Reported on 01/06/2016     ferrous sulfate 325 (65 FE) MG tablet  Take 325 mg by mouth 2 (two) times daily with a meal.     ondansetron 4 MG disintegrating tablet  Commonly known as:  ZOFRAN-ODT  Take 1 tablet (4 mg total) by mouth every 8 (eight) hours as needed for nausea or vomiting.     prenatal multivitamin Tabs tablet  Take 1 tablet by mouth at bedtime.     ranitidine 150 MG tablet  Commonly known as:  ZANTAC  Take 150 mg by mouth daily. Reported on 11/04/2015       Outpatient follow up:  Postpartum contraception:Nexplanon Discharged Condition: stable  Discharged to: home   Newborn Data:  Baby Boy doing well  Disposition Home with baby  Apgars: APGAR (1 MIN): 9   APGAR (5 MINS): 9   APGAR (10 MINS):    Baby Feeding:  Sharee Pimplearon W Wrenna Saks, CNM 01/07/2016

## 2016-01-07 NOTE — Progress Notes (Signed)
I have reviewed all charting done by Andrea Cox (student RN).Lena Fieldhouse RN 

## 2016-01-08 MED ORDER — DOCUSATE SODIUM 100 MG PO CAPS: 100.0000 mg | ORAL_CAPSULE | Freq: Two times a day (BID) | ORAL | Status: DC

## 2016-01-08 MED ORDER — IBUPROFEN 600 MG PO TABS: 600.0000 mg | ORAL_TABLET | Freq: Four times a day (QID) | ORAL | Status: DC

## 2016-01-08 NOTE — Progress Notes (Signed)
Post Partum Day 1 Subjective: Wants to go home but, baby did not get complete treatment for GBS  Objective: Blood pressure 118/60, pulse 78, temperature 98.3 F (36.8 C), temperature source Oral, resp. rate 20, height 5' 1.2" (1.554 m), weight 228 lb (103.42 kg), last menstrual period 04/12/2015, SpO2 99 %, unknown if currently breastfeeding.  Physical Exam:  General: alert and cooperative  Lungs: CTA bilat Heart: S1S2, RRR, No M/R/G.  Lochia: appropriate Uterine Fundus: firm DVT Evaluation:Neg   Recent Labs  01/06/16 0632 01/07/16 0550  HGB 10.8* 9.4*  HCT 32.1* 28.8*  WBC 8.6 10.0  PLT 330 256    Assessment/Plan: Plan for discharge tomorrow and Discharge home   LOS: 2 days   Madeline Todd 01/08/2016, 6:11 AM

## 2016-01-08 NOTE — Progress Notes (Signed)
Patient understands all discharge instructions and the need to make follow up appointments. Patient discharge via wheelchair with auxillary. 

## 2017-03-05 ENCOUNTER — Emergency Department
Admission: EM | Admit: 2017-03-05 | Discharge: 2017-03-05 | Disposition: A | Discharge status: Home / Self Care | Payer: BLUE CROSS/BLUE SHIELD | Attending: Emergency Medicine | Admitting: Emergency Medicine

## 2017-03-05 ENCOUNTER — Encounter: Payer: Self-pay | Admitting: Emergency Medicine

## 2017-03-05 DIAGNOSIS — J0101 Acute recurrent maxillary sinusitis: Secondary | ICD-10-CM | POA: Diagnosis not present

## 2017-03-05 DIAGNOSIS — R05 Cough: Secondary | ICD-10-CM | POA: Diagnosis present

## 2017-03-05 MED ORDER — AMOXICILLIN-POT CLAVULANATE 875-125 MG PO TABS: 1.0000 | ORAL_TABLET | Freq: Two times a day (BID) | ORAL | 0 refills | Status: AC

## 2017-03-05 NOTE — ED Provider Notes (Signed)
[facility] Emergency Department Provider Note  ____________________________________________  Time seen: Approximately 4:56 PM  I have reviewed the triage vital signs and the nursing notes.   HISTORY  Chief Complaint Cough; Sore Throat; and Otalgia    HPI Madeline Todd is a 26 y.o. female presenting to the emergency department with pharyngitis, right maxillary sinus tenderness, right otalgia, purulent rhinorrhea and low-grade fever for the past 3 days. Patient states that she has a history of recurrent sinus infections. Patient denies associated chest pain, chest tightness, shortness of breath, nausea, vomiting or abdominal pain. Patient has taken DayQuil. No other alleviating measures attempted.   Past Medical History:  Diagnosis Date  . Obesity affecting pregnancy in second trimester 2016    Patient Active Problem List   Diagnosis Date Noted  . Normal labor and delivery 01/06/2016  . Obesity affecting pregnancy 12/02/2015  . Nausea and vomiting during pregnancy 11/02/2015    Past Surgical History:  Procedure Laterality Date  . CYSTECTOMY     vaginal cyst    Prior to Admission medications   Medication Sig Start Date End Date Taking? Authorizing Provider  amoxicillin-clavulanate (AUGMENTIN) 875-125 MG tablet Take 1 tablet by mouth 2 (two) times daily. 03/05/17 03/15/17  Orvil FeilWoods, Omayra Tulloch M, PA-C  docusate sodium (COLACE) 100 MG capsule Take 1 capsule (100 mg total) by mouth 2 (two) times daily. 01/08/16   Schermerhorn, Ihor Austinhomas J, MD  ibuprofen (ADVIL,MOTRIN) 600 MG tablet Take 1 tablet (600 mg total) by mouth every 6 (six) hours. 01/08/16   Schermerhorn, Ihor Austinhomas J, MD  Prenatal Vit-Fe Fumarate-FA (PRENATAL MULTIVITAMIN) TABS tablet Take 1 tablet by mouth at bedtime.    [provider]  ranitidine (ZANTAC) 150 MG tablet Take 150 mg by mouth daily. Reported on 11/04/2015    [provider]    Allergies Shrimp [shellfish allergy]  No  family history on file.  Social History Social History  Substance Use Topics  . Smoking status: Never Smoker  . Smokeless tobacco: Never Used  . Alcohol use No     Review of Systems  Constitutional: Patient has had low-grade fever. Eyes: No visual changes. No discharge ENT: Patient has rhinorrhea, congestion and maxillary sinus tenderness. Cardiovascular: no chest pain. Respiratory: Patient has had intermittent cough. Gastrointestinal: No abdominal pain.  No nausea, no vomiting.  No diarrhea.  No constipation. Musculoskeletal: Negative for musculoskeletal pain. Skin: Negative for rash, abrasions, lacerations, ecchymosis. Neurological: Negative for headaches, focal weakness or numbness.   ____________________________________________   PHYSICAL EXAM:  VITAL SIGNS: ED Triage Vitals  Enc Vitals Group     BP 03/05/17 1551 (!) 147/83     Pulse Rate 03/05/17 1551 88     Resp 03/05/17 1551 18     Temp 03/05/17 1551 98 F (36.7 C)     Temp Source 03/05/17 1551 Oral     SpO2 03/05/17 1551 98 %     Weight 03/05/17 1602 235 lb (106.6 kg)     Height 03/05/17 1602 5' (1.524 m)     Head Circumference --      Peak Flow --      Pain Score 03/05/17 1602 8     Pain Loc --      Pain Edu? --      Excl. in GC? --      Constitutional: Alert and oriented. Well appearing and in no acute distress. Eyes: Conjunctivae are normal. PERRL. EOMI. Head: Atraumatic.She has maxillary sinus tenderness. ENT:  Ears: Tympanic membranes are pearly bilaterally.      Nose: Nasal turbinates are edematous.      Mouth/Throat: Mucous membranes are moist.  Neck: Full range of motion Cardiovascular: Normal rate, regular rhythm. Normal S1 and S2.  Good peripheral circulation. Respiratory: Normal respiratory effort without tachypnea or retractions. Lungs CTAB. Good air entry to the bases with no decreased or absent breath sounds. Musculoskeletal: Full range of motion to all extremities. No gross  deformities appreciated. Neurologic:  Normal speech and language. No gross focal neurologic deficits are appreciated.  Skin:  Skin is warm, dry and intact. No rash noted. Psychiatric: Mood and affect are normal. Speech and behavior are normal. Patient exhibits appropriate insight and judgement.   ____________________________________________   LABS (all labs ordered are listed, but only abnormal results are displayed)  Labs Reviewed - No data to display ____________________________________________  EKG   ____________________________________________  RADIOLOGY   No results found.  ____________________________________________    PROCEDURES  Procedure(s) performed:    Procedures    Medications - No data to display   ____________________________________________   INITIAL IMPRESSION / ASSESSMENT AND PLAN / ED COURSE  Pertinent labs & imaging results that were available during my care of the patient were reviewed by me and considered in my medical decision making (see chart for details).  Review of the [place] CSRS was performed in accordance of the [facility] prior to dispensing any controlled drugs.     Assessment and plan: Sinusitis: Patient presents to the emergency department with maxillary sinus tenderness, congestion, purulent rhinorrhea and low-grade fever. History and physical exam findings are consistent with sinusitis. Patient was discharged with Augmentin. Patient was advised to follow-up with primary care as needed. Vital signs are reassuring prior to discharge. All patient questions were answered.   ____________________________________________  FINAL CLINICAL IMPRESSION(S) / ED DIAGNOSES  Final diagnoses:  Acute recurrent maxillary sinusitis      NEW MEDICATIONS STARTED DURING THIS VISIT:  Discharge Medication List as of 03/05/2017  5:01 PM    START taking these medications   Details  amoxicillin-clavulanate (AUGMENTIN) 875-125 MG tablet Take 1  tablet by mouth 2 (two) times daily., Starting Mon 03/05/2017, Until Thu 03/15/2017, Print            This chart was dictated using voice recognition software/Dragon. Despite best efforts to proofread, errors can occur which can change the meaning. Any change was purely unintentional.    Orvil Feil, PA-C 03/05/17 1846    Rockne Menghini, MD 03/05/17 1900

## 2017-03-05 NOTE — ED Triage Notes (Signed)
States she developed sore throat,right ear pain with possible fever on Friday  Has occasional cough   Also having some right side facial pain with min swelling

## 2017-04-08 IMAGING — US US MFM OB FOLLOW-UP
1 series · 14 of 28 positions shown · non-contrast
Comparison: none

[Series 1: us mfm ob follow-up · 0.31mm/px · 14 of 58 slices shown]
[im 3/58]
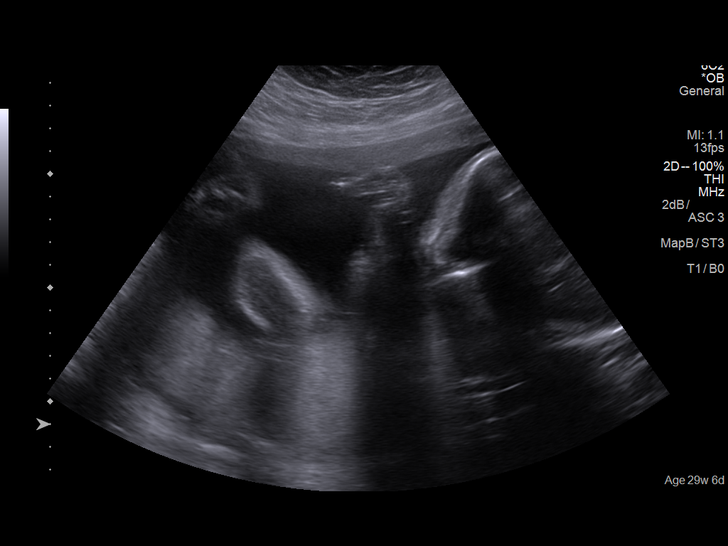
[im 7/58]
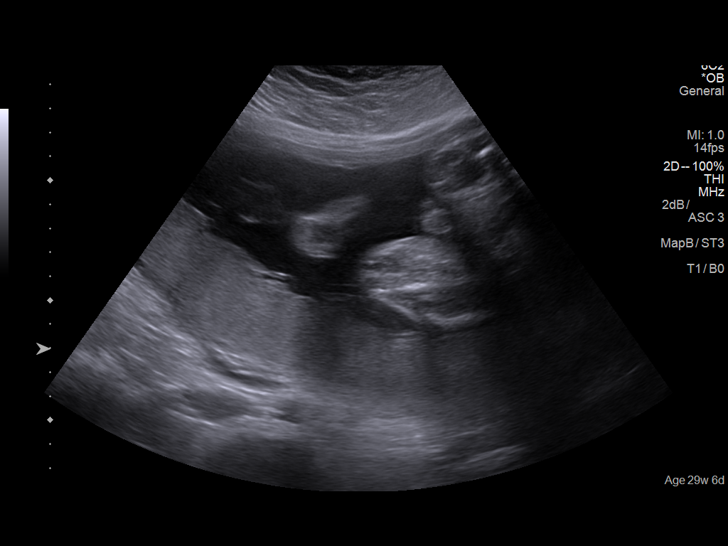
[im 11/58]
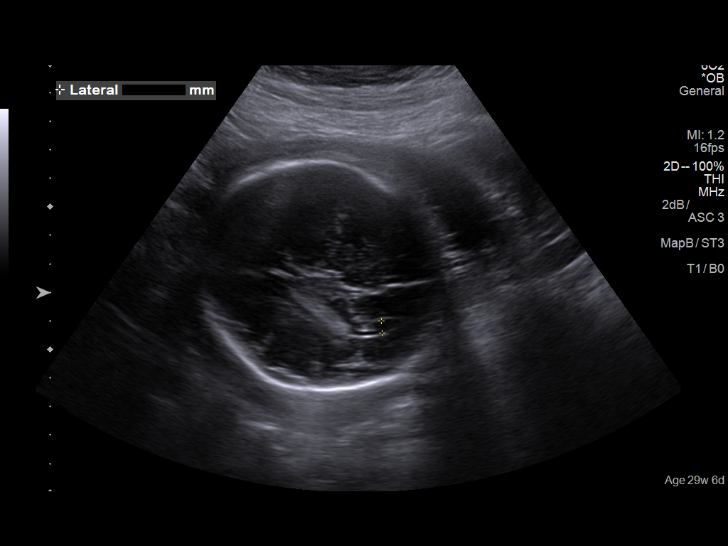
[im 15/58]
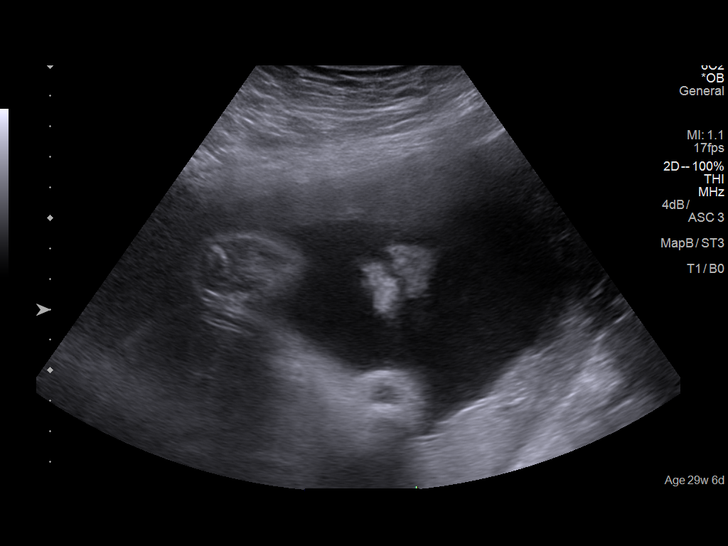
[im 20/58]
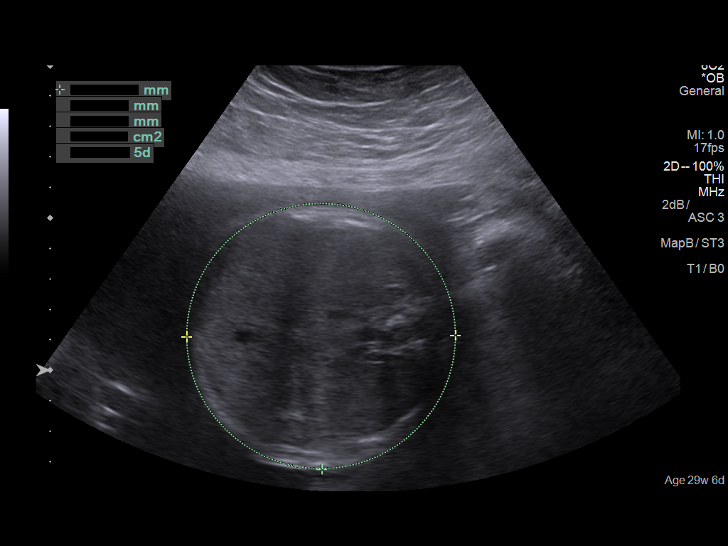
[im 24/58]
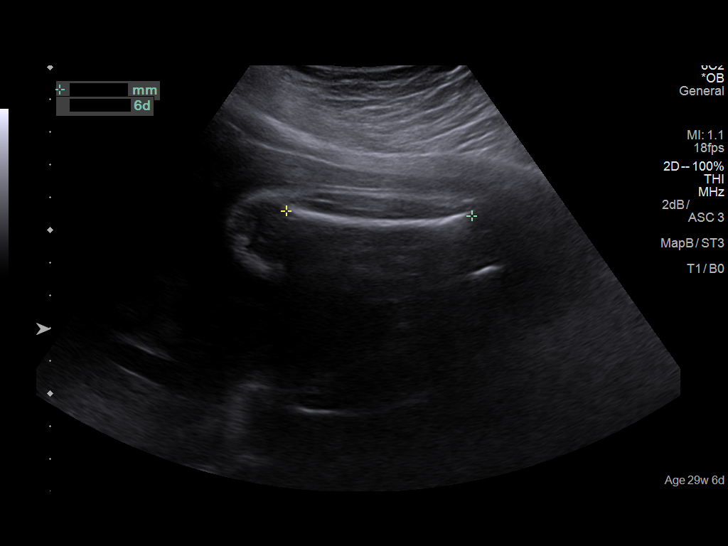
[im 28/58]
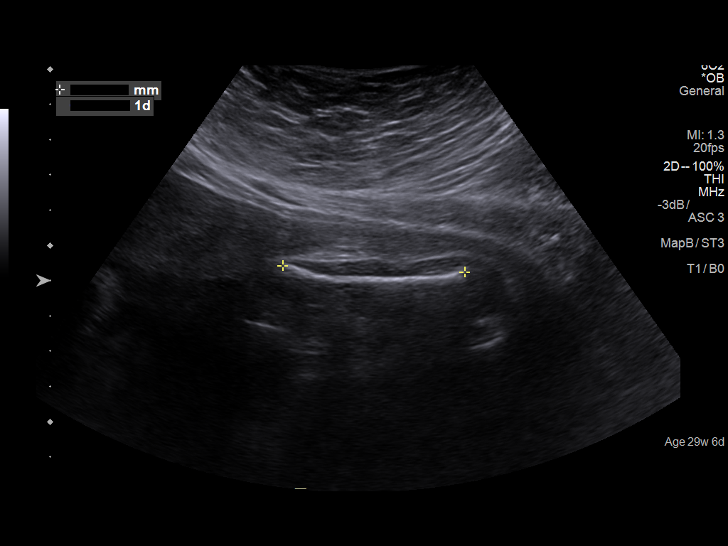
[im 32/58]
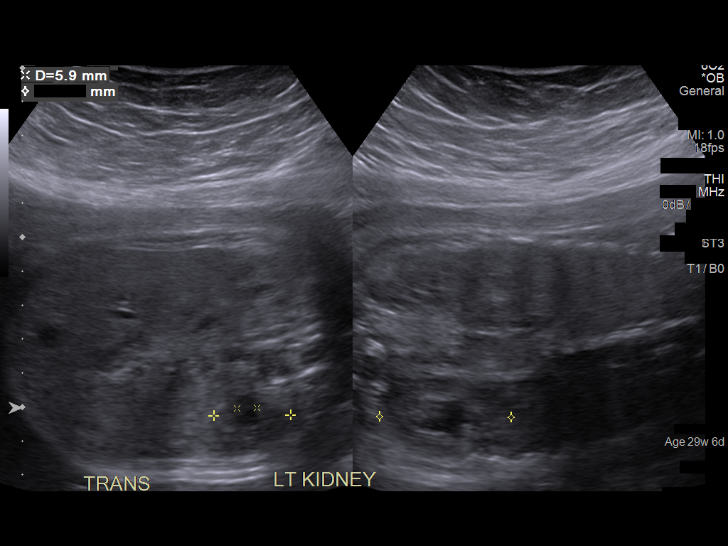
[im 36/58]
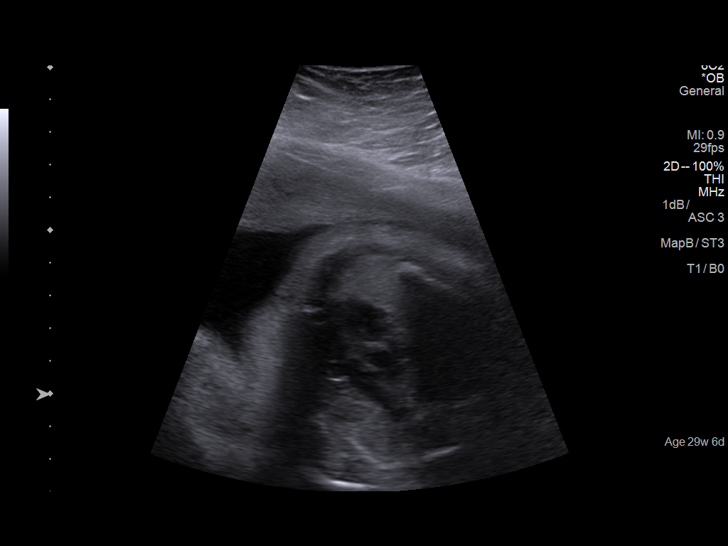
[im 41/58]
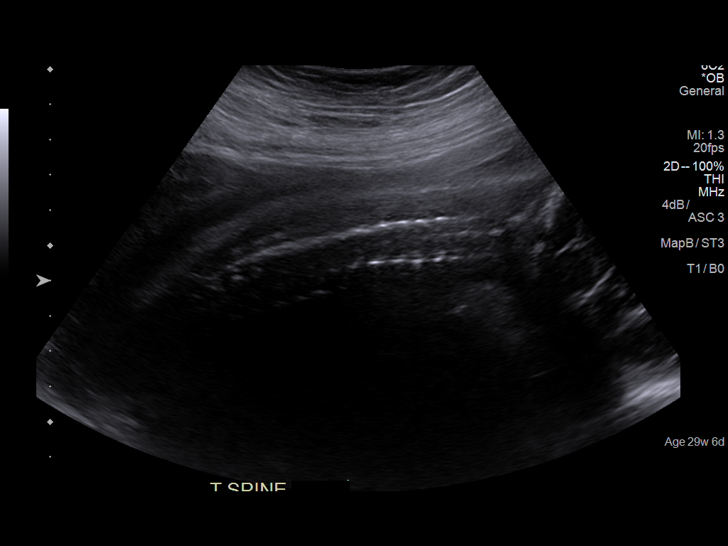
[im 45/58]
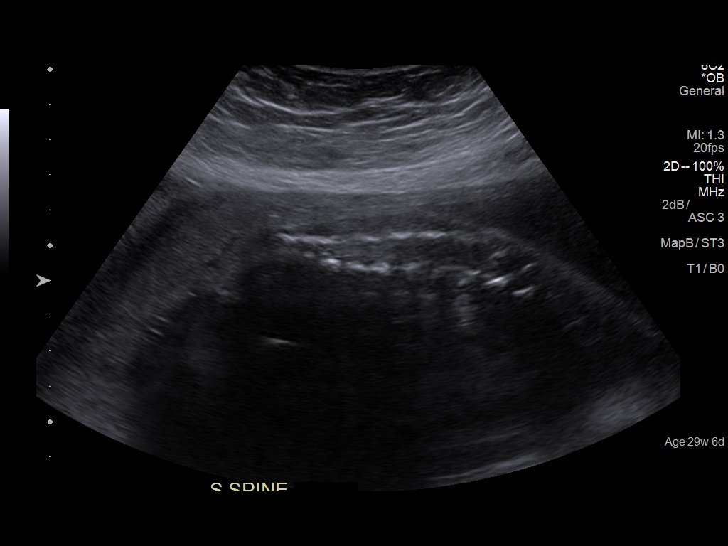
[im 49/58]
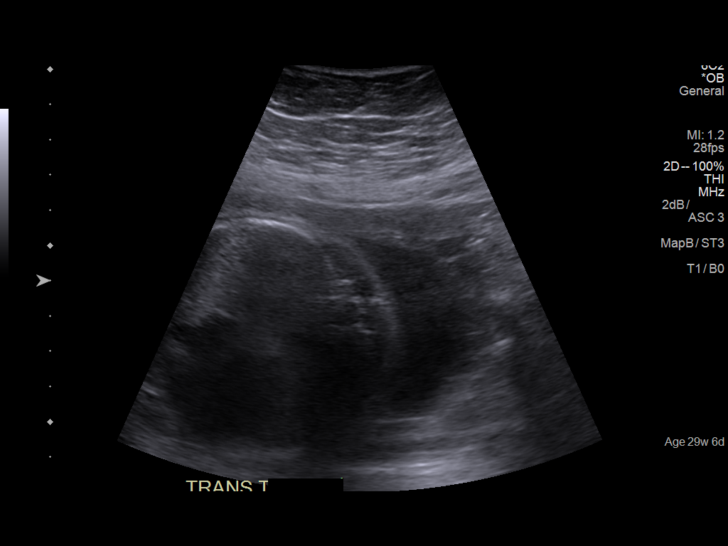
[im 53/58]
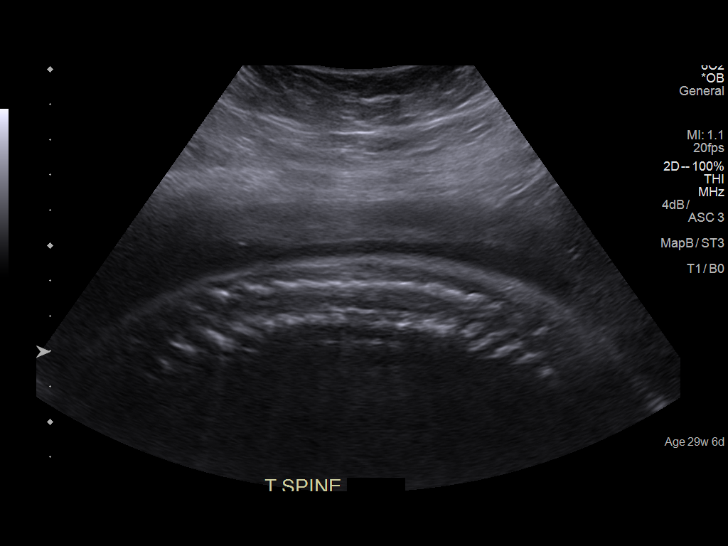
[im 58/58]
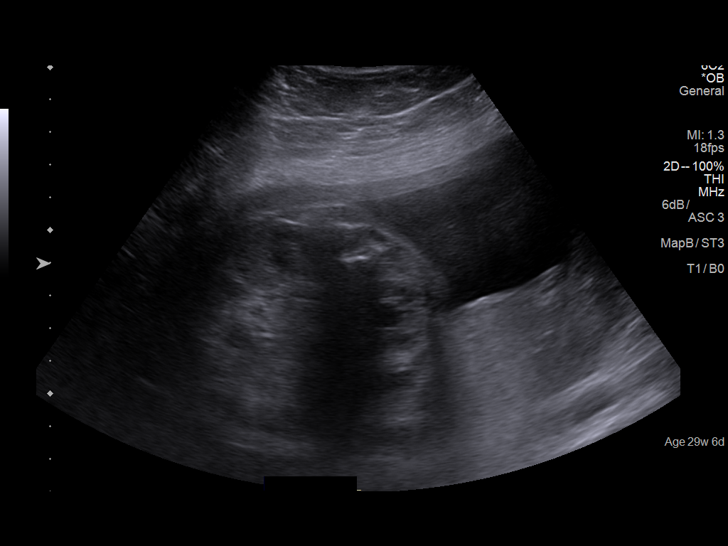

[14 of 28 positions shown; findings below may reference images not displayed]

Canned report from images found in remote index.

Refer to host system for actual result text.

## 2017-09-15 ENCOUNTER — Encounter: Payer: Self-pay | Admitting: Emergency Medicine

## 2017-09-15 ENCOUNTER — Emergency Department
Admission: EM | Admit: 2017-09-15 | Discharge: 2017-09-15 | Disposition: A | Discharge status: Home / Self Care | Payer: BLUE CROSS/BLUE SHIELD | Attending: Emergency Medicine | Admitting: Emergency Medicine

## 2017-09-15 ENCOUNTER — Other Ambulatory Visit: Payer: Self-pay

## 2017-09-15 DIAGNOSIS — R1012 Left upper quadrant pain: Secondary | ICD-10-CM | POA: Insufficient documentation

## 2017-09-15 DIAGNOSIS — R112 Nausea with vomiting, unspecified: Secondary | ICD-10-CM | POA: Insufficient documentation

## 2017-09-15 DIAGNOSIS — Z79899 Other long term (current) drug therapy: Secondary | ICD-10-CM | POA: Diagnosis not present

## 2017-09-15 DIAGNOSIS — R42 Dizziness and giddiness: Secondary | ICD-10-CM | POA: Insufficient documentation

## 2017-09-15 LAB — URINALYSIS, COMPLETE (UACMP) WITH MICROSCOPIC
Bacteria, UA: NONE SEEN
Bilirubin Urine: NEGATIVE
Glucose, UA: NEGATIVE mg/dL
KETONES UR: NEGATIVE mg/dL
Leukocytes, UA: NEGATIVE
Nitrite: NEGATIVE
PH: 8 (ref 5.0–8.0)
PROTEIN: NEGATIVE mg/dL
Specific Gravity, Urine: 1.01 (ref 1.005–1.030)

## 2017-09-15 LAB — CBC WITH DIFFERENTIAL/PLATELET
BASOS ABS: 0 10*3/uL (ref 0–0.1)
Basophils Relative: 0 %
Eosinophils Absolute: 0.1 10*3/uL (ref 0–0.7)
Eosinophils Relative: 1 %
HEMATOCRIT: 41.8 % (ref 35.0–47.0)
Hemoglobin: 14.2 g/dL (ref 12.0–16.0)
LYMPHS PCT: 9 %
Lymphs Abs: 1 10*3/uL (ref 1.0–3.6)
MCH: 28.9 pg (ref 26.0–34.0)
MCHC: 34 g/dL (ref 32.0–36.0)
MCV: 85.1 fL (ref 80.0–100.0)
Monocytes Absolute: 0.5 10*3/uL (ref 0.2–0.9)
Monocytes Relative: 4 %
NEUTROS ABS: 9.7 10*3/uL — AB (ref 1.4–6.5)
NEUTROS PCT: 86 %
Platelets: 364 10*3/uL (ref 150–440)
RBC: 4.91 MIL/uL (ref 3.80–5.20)
RDW: 14.6 % — ABNORMAL HIGH (ref 11.5–14.5)
WBC: 11.4 10*3/uL — AB (ref 3.6–11.0)

## 2017-09-15 LAB — COMPREHENSIVE METABOLIC PANEL
ALT: 24 U/L (ref 14–54)
AST: 26 U/L (ref 15–41)
Albumin: 4.4 g/dL (ref 3.5–5.0)
Alkaline Phosphatase: 106 U/L (ref 38–126)
Anion gap: 7 (ref 5–15)
BILIRUBIN TOTAL: 1 mg/dL (ref 0.3–1.2)
BUN: 14 mg/dL (ref 6–20)
CHLORIDE: 102 mmol/L (ref 101–111)
CO2: 26 mmol/L (ref 22–32)
CREATININE: 0.57 mg/dL (ref 0.44–1.00)
Calcium: 9 mg/dL (ref 8.9–10.3)
GFR calc Af Amer: >60 mL/min (ref >60)
GLUCOSE: 126 mg/dL — AB (ref 65–99)
Potassium: 4.1 mmol/L (ref 3.5–5.1)
Sodium: 135 mmol/L (ref 135–145)
TOTAL PROTEIN: 7.8 g/dL (ref 6.5–8.1)

## 2017-09-15 LAB — LIPASE, BLOOD: LIPASE: 22 U/L (ref 11–51)

## 2017-09-15 LAB — POCT PREGNANCY, URINE: Preg Test, Ur: NEGATIVE

## 2017-09-15 MED ORDER — MECLIZINE HCL 25 MG PO TABS
25.0000 mg | ORAL_TABLET | Freq: Once | ORAL | Status: AC
  Administered 2017-09-15: 25 mg via ORAL
  Filled 2017-09-15: qty 1

## 2017-09-15 MED ORDER — SODIUM CHLORIDE 0.9 % IV BOLUS (SEPSIS)
1000.0000 mL | Freq: Once | INTRAVENOUS | Status: AC
  Administered 2017-09-15: 1000 mL via INTRAVENOUS

## 2017-09-15 MED ORDER — MECLIZINE HCL 25 MG PO TABS: 25.0000 mg | ORAL_TABLET | Freq: Three times a day (TID) | ORAL | 0 refills | Status: DC | PRN

## 2017-09-15 MED ORDER — ONDANSETRON HCL 4 MG PO TABS: 4.0000 mg | ORAL_TABLET | Freq: Every day | ORAL | 0 refills | Status: DC | PRN

## 2017-09-15 MED ORDER — ONDANSETRON HCL 4 MG/2ML IJ SOLN
4.0000 mg | Freq: Once | INTRAMUSCULAR | Status: AC
  Administered 2017-09-15: 4 mg via INTRAVENOUS
  Filled 2017-09-15: qty 2

## 2017-09-15 NOTE — ED Triage Notes (Signed)
Pt reports that she woke up this am with N/V, she states that she got dizzy and felt weak.

## 2017-09-15 NOTE — ED Notes (Signed)
ED Provider at bedside. 

## 2017-09-15 NOTE — ED Provider Notes (Addendum)
[facility] Emergency Department Provider Note  ____________________________________________   First MD Initiated Contact with Patient 09/15/17 [phone]     (approximate)  I have reviewed the triage vital signs and the nursing notes.   HISTORY  Chief Complaint Dizziness   HPI Madeline Todd is a 27 y.o. female without any chronic medical history who was presented to the emergency department with lightheaded and dizziness that started this morning upon waking.  Says that she vomited several times and then felt dizzy thereafter as well as lightheaded like she was going to pass out.  She denies any ear pain or pressure.  Denies any known sick contacts.  Denies any diarrhea.  No history of vertigo.  Past Medical History:  Diagnosis Date  . Obesity affecting pregnancy in second trimester 2016    Patient Active Problem List   Diagnosis Date Noted  . Normal labor and delivery 01/06/2016  . Obesity affecting pregnancy 12/02/2015  . Nausea and vomiting during pregnancy 11/02/2015    Past Surgical History:  Procedure Laterality Date  . CYSTECTOMY     vaginal cyst    Prior to Admission medications   Medication Sig Start Date End Date Taking? Authorizing Provider  docusate sodium (COLACE) 100 MG capsule Take 1 capsule (100 mg total) by mouth 2 (two) times daily. 01/08/16   Schermerhorn, Ihor Austinhomas J, MD  ibuprofen (ADVIL,MOTRIN) 600 MG tablet Take 1 tablet (600 mg total) by mouth every 6 (six) hours. 01/08/16   Schermerhorn, Ihor Austinhomas J, MD  Prenatal Vit-Fe Fumarate-FA (PRENATAL MULTIVITAMIN) TABS tablet Take 1 tablet by mouth at bedtime.    [provider]  ranitidine (ZANTAC) 150 MG tablet Take 150 mg by mouth daily. Reported on 11/04/2015    [provider]    Allergies Shrimp [shellfish allergy]  No family history on file.  Social History Social History   Tobacco Use  . Smoking status: Never Smoker  . Smokeless tobacco: Never Used    Substance Use Topics  . Alcohol use: No  . Drug use: No    Review of Systems  Constitutional: No fever/chills Eyes: No visual changes. ENT: No sore throat. Cardiovascular: Denies chest pain. Respiratory: Denies shortness of breath. Gastrointestinal:  No nausea, no vomiting.  No diarrhea.  No constipation. Genitourinary: Negative for dysuria. Musculoskeletal: Negative for back pain. Skin: Negative for rash. Neurological: Negative for headaches, focal weakness or numbness.   ____________________________________________   PHYSICAL EXAM:  VITAL SIGNS: ED Triage Vitals  Enc Vitals Group     BP 09/15/17 0942 (!) 100/49     Pulse Rate 09/15/17 0942 70     Resp 09/15/17 0942 18     Temp 09/15/17 0942 98.4 F (36.9 C)     Temp Source 09/15/17 0942 Oral     SpO2 09/15/17 0942 100 %     Weight 09/15/17 0943 240 lb (108.9 kg)     Height 09/15/17 0943 5\' 1"  (1.549 m)     Head Circumference --      Peak Flow --      Pain Score 09/15/17 0941 6     Pain Loc --      Pain Edu? --      Excl. in GC? --     Constitutional: Alert and oriented. Well appearing and in no acute distress. Eyes: Conjunctivae are normal.  No nystagmus. Head: Atraumatic.  Normal TMs bilaterally. Nose: No congestion/rhinnorhea. Mouth/Throat: Mucous membranes are moist.  Neck: No stridor.   Cardiovascular: Normal  rate, regular rhythm. Grossly normal heart sounds. Respiratory: Normal respiratory effort.  No retractions. Lungs CTAB. Gastrointestinal: Soft with mild left upper quadrant tenderness to palpation.. No distention.  Musculoskeletal: No lower extremity tenderness nor edema.  No joint effusions. Neurologic:  Normal speech and language. No gross focal neurologic deficits are appreciated.  No ataxia on finger to nose or heel to shin testing. Skin:  Skin is warm, dry and intact. No rash noted. Psychiatric: Mood and affect are normal. Speech and behavior are  normal.  ____________________________________________   LABS (all labs ordered are listed, but only abnormal results are displayed)  Labs Reviewed  CBC WITH DIFFERENTIAL/PLATELET - Abnormal; Notable for the following components:      Result Value   WBC 11.4 (*)    RDW 14.6 (*)    Neutro Abs 9.7 (*)    All other components within normal limits  COMPREHENSIVE METABOLIC PANEL - Abnormal; Notable for the following components:   Glucose, Bld 126 (*)    All other components within normal limits  URINALYSIS, COMPLETE (UACMP) WITH MICROSCOPIC - Abnormal; Notable for the following components:   Color, Urine YELLOW (*)    APPearance CLEAR (*)    Hgb urine dipstick SMALL (*)    Squamous Epithelial / LPF 0-5 (*)    All other components within normal limits  LIPASE, BLOOD  POC URINE PREG, ED  POCT PREGNANCY, URINE   ____________________________________________  EKG  ED ECG REPORT I, Arelia Longest, the attending physician, personally viewed and interpreted this ECG.   Date: 09/15/2017  EKG Time: 1113  Rate: 62  Rhythm: normal sinus rhythm  Axis: Normal  Intervals:none  ST&T Change: No ST segment elevation or depression.  No abnormal T wave inversion.  ____________________________________________  RADIOLOGY   ____________________________________________   PROCEDURES  Procedure(s) performed:   Procedures  Critical Care performed:   ____________________________________________   INITIAL IMPRESSION / ASSESSMENT AND PLAN / ED COURSE  Pertinent labs & imaging results that were available during my care of the patient were reviewed by me and considered in my medical decision making (see chart for details).  Differential diagnosis includes, but is not limited to, biliary disease (biliary colic, acute cholecystitis, cholangitis, choledocholithiasis, etc), intrathoracic causes for epigastric abdominal pain including ACS, gastritis, duodenitis, pancreatitis, small bowel or  large bowel obstruction, abdominal aortic aneurysm, hernia, and gastritis.  Vertigo, labyrinthitis. As part of my medical decision making, I reviewed the following data within the electronic MEDICAL RECORD NUMBER Notes from prior ED visits   ----------------------------------------- 1:26 PM on 09/15/2017 -----------------------------------------  After fluids, Zofran and meclizine the patient is asymptomatic.  I repalpated her abdomen and she has not throughout.  She says that she has been able to get up and walk around without any issue including no dizziness, near syncopal feeling.  Reassuring labs.  Slightly elevated white blood cell count but there has been no right lower quadrant left lower quadrant or right upper quadrant tenderness or pain reported.  The patient's left upper quadrant tenderness has resolved and was likely from the vomiting episodes this morning.  We discussed slowly easing her way back into her normal diet.  Possible viral illness causing the current symptoms.     ____________________________________________   FINAL CLINICAL IMPRESSION(S) / ED DIAGNOSES  Nausea and vomiting.  Vertigo.  Abdominal pain.    NEW MEDICATIONS STARTED DURING THIS VISIT:  This SmartLink is deprecated. Use AVSMEDLIST instead to display the medication list for a patient.   Note:  This document was prepared using Dragon voice recognition software and may include unintentional dictation errors.     Myrna Blazer, MD 09/15/17 1327    Pershing Proud, Myra Rude, MD 09/15/17 1330

## 2018-01-28 ENCOUNTER — Other Ambulatory Visit: Payer: Self-pay

## 2018-01-28 ENCOUNTER — Encounter: Payer: Self-pay | Admitting: Emergency Medicine

## 2018-01-28 ENCOUNTER — Emergency Department
Admission: EM | Admit: 2018-01-28 | Discharge: 2018-01-28 | Disposition: A | Discharge status: Home / Self Care | Payer: BLUE CROSS/BLUE SHIELD | Attending: Emergency Medicine | Admitting: Emergency Medicine

## 2018-01-28 DIAGNOSIS — R05 Cough: Secondary | ICD-10-CM | POA: Diagnosis present

## 2018-01-28 DIAGNOSIS — Z79899 Other long term (current) drug therapy: Secondary | ICD-10-CM | POA: Diagnosis not present

## 2018-01-28 DIAGNOSIS — J069 Acute upper respiratory infection, unspecified: Secondary | ICD-10-CM | POA: Diagnosis not present

## 2018-01-28 DIAGNOSIS — B9789 Other viral agents as the cause of diseases classified elsewhere: Secondary | ICD-10-CM

## 2018-01-28 MED ORDER — BENZONATATE 100 MG PO CAPS: 100.0000 mg | ORAL_CAPSULE | Freq: Three times a day (TID) | ORAL | 0 refills | Status: AC | PRN

## 2018-01-28 NOTE — ED Provider Notes (Signed)
[facility] Emergency Department Provider Note  ____________________________________________  Time seen: Approximately 3:25 PM  I have reviewed the triage vital signs and the nursing notes.   HISTORY  Chief Complaint Cough and Sore Throat    HPI Madeline Todd is a 27 y.o. female presents to the emergency department with rhinorrhea, congestion, nonproductive cough and pharyngitis for the past 2 days.  Patient has been tolerating fluids by mouth.  No major changes in stooling or urinary habits.  No emesis.  Patient reports a fever of 39 F assessed orally at home.  She denies chest pain and abdominal pain.   Past Medical History:  Diagnosis Date  . Obesity affecting pregnancy in second trimester 2016    Patient Active Problem List   Diagnosis Date Noted  . Normal labor and delivery 01/06/2016  . Obesity affecting pregnancy 12/02/2015  . Nausea and vomiting during pregnancy 11/02/2015    Past Surgical History:  Procedure Laterality Date  . CYSTECTOMY     vaginal cyst    Prior to Admission medications   Medication Sig Start Date End Date Taking? Authorizing Provider  benzonatate (TESSALON PERLES) 100 MG capsule Take 1 capsule (100 mg total) by mouth 3 (three) times daily as needed for up to 7 days for cough. 01/28/18 02/04/18  Orvil Feil, PA-C  docusate sodium (COLACE) 100 MG capsule Take 1 capsule (100 mg total) by mouth 2 (two) times daily. 01/08/16   Schermerhorn, Ihor Austin, MD  ibuprofen (ADVIL,MOTRIN) 600 MG tablet Take 1 tablet (600 mg total) by mouth every 6 (six) hours. 01/08/16   Schermerhorn, Ihor Austin, MD  meclizine (ANTIVERT) 25 MG tablet Take 1 tablet (25 mg total) by mouth 3 (three) times daily as needed for dizziness. 09/15/17   Myrna Blazer, MD  ondansetron (ZOFRAN) 4 MG tablet Take 1 tablet (4 mg total) by mouth daily as needed. 09/15/17   Myrna Blazer, MD  Prenatal Vit-Fe Fumarate-FA (PRENATAL MULTIVITAMIN)  TABS tablet Take 1 tablet by mouth at bedtime.    [provider]  ranitidine (ZANTAC) 150 MG tablet Take 150 mg by mouth daily. Reported on 11/04/2015    [provider]    Allergies Shrimp [shellfish allergy]  No family history on file.  Social History Social History   Tobacco Use  . Smoking status: Never Smoker  . Smokeless tobacco: Never Used  Substance Use Topics  . Alcohol use: No  . Drug use: No      Review of Systems  Constitutional: Patient has fever.  Eyes: No visual changes. No discharge ENT: Patient has congestion.  Cardiovascular: no chest pain. Respiratory: Patient has cough.  Gastrointestinal: No abdominal pain.  No nausea, no vomiting. Patient had diarrhea.  Genitourinary: Negative for dysuria. No hematuria Musculoskeletal: Patient has myalgias.  Skin: Negative for rash, abrasions, lacerations, ecchymosis. Neurological: Patient has headache, no focal weakness or numbness.    ____________________________________________   PHYSICAL EXAM:  VITAL SIGNS: ED Triage Vitals  Enc Vitals Group     BP 01/28/18 1449 127/69     Pulse Rate 01/28/18 1449 88     Resp 01/28/18 1449 20     Temp 01/28/18 1449 98.9 F (37.2 C)     Temp Source 01/28/18 1449 Oral     SpO2 01/28/18 1449 97 %     Weight 01/28/18 1450 238 lb (108 kg)     Height 01/28/18 1450  (1.549 m)     Head Circumference --  Peak Flow --      Pain Score 01/28/18 1450 8     Pain Loc --      Pain Edu? --      Excl. in GC? --     Constitutional: Alert and oriented. Patient is lying supine. Eyes: Conjunctivae are normal. PERRL. EOMI. Head: Atraumatic. ENT:      Ears: Tympanic membranes are mildly injected with mild effusion bilaterally.       Nose: No congestion/rhinnorhea.      Mouth/Throat: Mucous membranes are moist. Posterior pharynx is mildly erythematous.  Hematological/Lymphatic/Immunilogical: No cervical lymphadenopathy.  Cardiovascular: Normal rate,  regular rhythm. Normal S1 and S2.  Good peripheral circulation. Respiratory: Normal respiratory effort without tachypnea or retractions. Lungs CTAB. Good air entry to the bases with no decreased or absent breath sounds. Gastrointestinal: Bowel sounds 4 quadrants. Soft and nontender to palpation. No guarding or rigidity. No palpable masses. No distention. No CVA tenderness. Musculoskeletal: Full range of motion to all extremities. No gross deformities appreciated. Neurologic:  Normal speech and language. No gross focal neurologic deficits are appreciated.  Skin:  Skin is warm, dry and intact. No rash noted. Psychiatric: Mood and affect are normal. Speech and behavior are normal. Patient exhibits appropriate insight and judgement.    ____________________________________________   LABS (all labs ordered are listed, but only abnormal results are displayed)  Labs Reviewed - No data to display ____________________________________________  EKG   ____________________________________________  RADIOLOGY  No results found.  ____________________________________________    PROCEDURES  Procedure(s) performed:    Procedures    Medications - No data to display   ____________________________________________   INITIAL IMPRESSION / ASSESSMENT AND PLAN / ED COURSE  Pertinent labs & imaging results that were available during my care of the patient were reviewed by me and considered in my medical decision making (see chart for details).  Review of the [place] CSRS was performed in accordance of the [facility] prior to dispensing any controlled drugs.    Assessment and plan Viral URI Patient presents to the emergency department with rhinorrhea, congestion, nonproductive cough and fever.  History and physical exam findings are consistent with a viral URI.  Supportive measures were encouraged and patient was discharged with Sondra Come.  Vital signs are reassuring prior to  discharge.    ____________________________________________  FINAL CLINICAL IMPRESSION(S) / ED DIAGNOSES  Final diagnoses:  Viral URI with cough      NEW MEDICATIONS STARTED DURING THIS VISIT:  ED Discharge Orders        Ordered    benzonatate (TESSALON PERLES) 100 MG capsule  3 times daily PRN     01/28/18 1520          This chart was dictated using voice recognition software/Dragon. Despite best efforts to proofread, errors can occur which can change the meaning. Any change was purely unintentional.    Orvil Feil, PA-C 01/28/18 1527    Minna Antis, MD 01/28/18 [phone]

## 2018-01-28 NOTE — ED Triage Notes (Signed)
Cough and sore throat x 2 days.

## 2018-05-10 ENCOUNTER — Emergency Department
Admission: EM | Admit: 2018-05-10 | Discharge: 2018-05-10 | Disposition: A | Discharge status: Home / Self Care | Payer: BLUE CROSS/BLUE SHIELD | Attending: Emergency Medicine | Admitting: Emergency Medicine

## 2018-05-10 ENCOUNTER — Emergency Department: Payer: BLUE CROSS/BLUE SHIELD

## 2018-05-10 ENCOUNTER — Other Ambulatory Visit: Payer: Self-pay

## 2018-05-10 ENCOUNTER — Encounter: Payer: Self-pay | Admitting: Emergency Medicine

## 2018-05-10 DIAGNOSIS — R110 Nausea: Secondary | ICD-10-CM | POA: Diagnosis not present

## 2018-05-10 DIAGNOSIS — R1032 Left lower quadrant pain: Secondary | ICD-10-CM | POA: Diagnosis present

## 2018-05-10 DIAGNOSIS — Z79899 Other long term (current) drug therapy: Secondary | ICD-10-CM | POA: Diagnosis not present

## 2018-05-10 DIAGNOSIS — N23 Unspecified renal colic: Secondary | ICD-10-CM | POA: Diagnosis not present

## 2018-05-10 LAB — COMPREHENSIVE METABOLIC PANEL
ALK PHOS: 75 U/L (ref 38–126)
ALT: 23 U/L (ref 0–44)
ANION GAP: 5 (ref 5–15)
AST: 25 U/L (ref 15–41)
Albumin: 4.1 g/dL (ref 3.5–5.0)
BUN: 9 mg/dL (ref 6–20)
CHLORIDE: 108 mmol/L (ref 98–111)
CO2: 27 mmol/L (ref 22–32)
Calcium: 8.6 mg/dL — ABNORMAL LOW (ref 8.9–10.3)
Creatinine, Ser: 0.58 mg/dL (ref 0.44–1.00)
GFR calc Af Amer: >60 mL/min (ref >60)
GFR calc non Af Amer: >60 mL/min (ref >60)
GLUCOSE: 125 mg/dL — AB (ref 70–99)
POTASSIUM: 3.6 mmol/L (ref 3.5–5.1)
SODIUM: 140 mmol/L (ref 135–145)
Total Bilirubin: 0.7 mg/dL (ref 0.3–1.2)
Total Protein: 6.9 g/dL (ref 6.5–8.1)

## 2018-05-10 LAB — CBC WITH DIFFERENTIAL/PLATELET
BASOS ABS: 0 10*3/uL (ref 0–0.1)
BASOS PCT: 1 %
Eosinophils Absolute: 0.3 10*3/uL (ref 0–0.7)
Eosinophils Relative: 3 %
HEMATOCRIT: 38.2 % (ref 35.0–47.0)
HEMOGLOBIN: 13.4 g/dL (ref 12.0–16.0)
LYMPHS PCT: 29 %
Lymphs Abs: 2.3 10*3/uL (ref 1.0–3.6)
MCH: 30.1 pg (ref 26.0–34.0)
MCHC: 35 g/dL (ref 32.0–36.0)
MCV: 85.9 fL (ref 80.0–100.0)
MONO ABS: 0.4 10*3/uL (ref 0.2–0.9)
MONOS PCT: 6 %
NEUTROS ABS: 5 10*3/uL (ref 1.4–6.5)
NEUTROS PCT: 61 %
Platelets: 336 10*3/uL (ref 150–440)
RBC: 4.45 MIL/uL (ref 3.80–5.20)
RDW: 14.1 % (ref 11.5–14.5)
WBC: 7.9 10*3/uL (ref 3.6–11.0)

## 2018-05-10 LAB — HCG, QUANTITATIVE, PREGNANCY: hCG, Beta Chain, Quant, S: <1 m[IU]/mL (ref <5)

## 2018-05-10 LAB — LIPASE, BLOOD: Lipase: 24 U/L (ref 11–51)

## 2018-05-10 MED ORDER — KETOROLAC TROMETHAMINE 30 MG/ML IJ SOLN
15.0000 mg | Freq: Once | INTRAMUSCULAR | Status: AC
  Administered 2018-05-10: 15 mg via INTRAVENOUS
  Filled 2018-05-10: qty 1

## 2018-05-10 MED ORDER — SODIUM CHLORIDE 0.9 % IV BOLUS
1000.0000 mL | Freq: Once | INTRAVENOUS | Status: AC
  Administered 2018-05-10: 1000 mL via INTRAVENOUS

## 2018-05-10 MED ORDER — MORPHINE SULFATE (PF) 4 MG/ML IV SOLN
8.0000 mg | Freq: Once | INTRAVENOUS | Status: AC
  Administered 2018-05-10: 8 mg via INTRAVENOUS
  Filled 2018-05-10: qty 2

## 2018-05-10 MED ORDER — IOPAMIDOL (ISOVUE-300) INJECTION 61%
100.0000 mL | Freq: Once | INTRAVENOUS | Status: AC | PRN
  Administered 2018-05-10: 100 mL via INTRAVENOUS

## 2018-05-10 MED ORDER — HALOPERIDOL LACTATE 5 MG/ML IJ SOLN
2.5000 mg | Freq: Once | INTRAMUSCULAR | Status: AC
  Administered 2018-05-10: 2.5 mg via INTRAVENOUS
  Filled 2018-05-10: qty 1

## 2018-05-10 MED ORDER — MORPHINE SULFATE (PF) 4 MG/ML IV SOLN
INTRAVENOUS | Status: AC
  Filled 2018-05-10: qty 2

## 2018-05-10 MED ORDER — IBUPROFEN 600 MG PO TABS: 600.0000 mg | ORAL_TABLET | Freq: Three times a day (TID) | ORAL | 0 refills | Status: DC | PRN

## 2018-05-10 MED ORDER — HALOPERIDOL LACTATE 5 MG/ML IJ SOLN
2.5000 mg | Freq: Once | INTRAMUSCULAR | Status: AC
  Administered 2018-05-10: 2.5 mg via INTRAVENOUS

## 2018-05-10 MED ORDER — HYDROCODONE-ACETAMINOPHEN 5-325 MG PO TABS: 1.0000 | ORAL_TABLET | Freq: Four times a day (QID) | ORAL | 0 refills | Status: DC | PRN

## 2018-05-10 MED ORDER — FENTANYL CITRATE (PF) 100 MCG/2ML IJ SOLN
50.0000 ug | Freq: Once | INTRAMUSCULAR | Status: AC
  Administered 2018-05-10: 50 ug via INTRAVENOUS
  Filled 2018-05-10: qty 2

## 2018-05-10 MED ORDER — MORPHINE SULFATE (PF) 4 MG/ML IV SOLN
8.0000 mg | Freq: Once | INTRAVENOUS | Status: AC
  Administered 2018-05-10: 8 mg via INTRAVENOUS

## 2018-05-10 MED ORDER — ONDANSETRON HCL 4 MG/2ML IJ SOLN
4.0000 mg | Freq: Once | INTRAMUSCULAR | Status: AC
  Administered 2018-05-10: 4 mg via INTRAVENOUS
  Filled 2018-05-10: qty 2

## 2018-05-10 MED ORDER — ONDANSETRON 4 MG PO TBDP: 4.0000 mg | ORAL_TABLET | Freq: Three times a day (TID) | ORAL | 0 refills | Status: DC | PRN

## 2018-05-10 MED ORDER — HALOPERIDOL LACTATE 5 MG/ML IJ SOLN
INTRAMUSCULAR | Status: AC
  Filled 2018-05-10: qty 1

## 2018-05-10 NOTE — ED Provider Notes (Signed)
[facility] Emergency Department Provider Note  ____________________________________________   First MD Initiated Contact with Patient 05/10/18 [phone]     (approximate)  I have reviewed the triage vital signs and the nursing notes.   HISTORY  Chief Complaint No chief complaint on file.   HPI Linzee Depaul is a 27 y.o. female who self presents to the emergency department with sudden onset severe left lower abdominal pain that began several hours prior to arrival.  Associated with nausea but no vomiting.  She has a past medical history of obesity as well as a previous cystectomy from an ovarian cyst on her left side.  She is currently menstruating.  She denies fevers or chills.  The pain is constant.  Nothing seems to make it better or worse.  Is nonradiating.  No diarrhea or constipation.  No chest pain or shortness of breath.    Past Medical History:  Diagnosis Date  . Obesity affecting pregnancy in second trimester 2016    Patient Active Problem List   Diagnosis Date Noted  . Normal labor and delivery 01/06/2016  . Obesity affecting pregnancy 12/02/2015  . Nausea and vomiting during pregnancy 11/02/2015    Past Surgical History:  Procedure Laterality Date  . CYSTECTOMY     vaginal cyst    Prior to Admission medications   Medication Sig Start Date End Date Taking? Authorizing Provider  docusate sodium (COLACE) 100 MG capsule Take 1 capsule (100 mg total) by mouth 2 (two) times daily. 01/08/16   Schermerhorn, Ihor Austin, MD  HYDROcodone-acetaminophen (NORCO) 5-325 MG tablet Take 1 tablet by mouth every 6 (six) hours as needed for up to 15 doses for severe pain. 05/10/18   Merrily Brittle, MD  ibuprofen (ADVIL,MOTRIN) 600 MG tablet Take 1 tablet (600 mg total) by mouth every 8 (eight) hours as needed. 05/10/18   Merrily Brittle, MD  meclizine (ANTIVERT) 25 MG tablet Take 1 tablet (25 mg total) by mouth 3 (three) times daily as needed for dizziness.  09/15/17   Myrna Blazer, MD  ondansetron (ZOFRAN ODT) 4 MG disintegrating tablet Take 1 tablet (4 mg total) by mouth every 8 (eight) hours as needed for nausea or vomiting. 05/10/18   Merrily Brittle, MD  ondansetron (ZOFRAN) 4 MG tablet Take 1 tablet (4 mg total) by mouth daily as needed. 09/15/17   Myrna Blazer, MD  Prenatal Vit-Fe Fumarate-FA (PRENATAL MULTIVITAMIN) TABS tablet Take 1 tablet by mouth at bedtime.    [provider]  ranitidine (ZANTAC) 150 MG tablet Take 150 mg by mouth daily. Reported on 11/04/2015    [provider]    Allergies Shrimp [shellfish allergy]  No family history on file.  Social History Social History   Tobacco Use  . Smoking status: Never Smoker  . Smokeless tobacco: Never Used  Substance Use Topics  . Alcohol use: No  . Drug use: No    Review of Systems Constitutional: No fever/chills Eyes: No visual changes. ENT: No sore throat. Cardiovascular: Denies chest pain. Respiratory: Denies shortness of breath. Gastrointestinal: Positive for abdominal pain.  Positive for nausea, no vomiting.  No diarrhea.  No constipation. Genitourinary: Negative for dysuria. Musculoskeletal: Negative for back pain. Skin: Negative for rash. Neurological: Negative for headaches, focal weakness or numbness.   ____________________________________________   PHYSICAL EXAM:  VITAL SIGNS: ED Triage Vitals  Enc Vitals Group     BP 05/10/18 0535 130/67     Pulse Rate 05/10/18 0535 62  Resp 05/10/18 0535 20     Temp 05/10/18 0535 98.4 F (36.9 C)     Temp Source 05/10/18 0535 Oral     SpO2 05/10/18 0535 99 %     Weight 05/10/18 0531 247 lb (112 kg)     Height 05/10/18 0531 5\' 1"  (1.549 m)     Head Circumference --      Peak Flow --      Pain Score 05/10/18 0531 9     Pain Loc --      Pain Edu? --      Excl. in GC? --     Constitutional: Alert and oriented x4 appears exquisitely uncomfortable writhing on bed and  tearful Eyes: PERRL EOMI. Head: Atraumatic. Nose: No congestion/rhinnorhea. Mouth/Throat: No trismus Neck: No stridor.   Cardiovascular: Normal rate, regular rhythm. Grossly normal heart sounds.  Good peripheral circulation. Respiratory: Normal respiratory effort.  No retractions. Lungs CTAB and moving good air Gastrointestinal: Obese abdomen quite tender left lower quadrant with mild rebound.  No McBurney's tenderness.  Negative Rovsing's. Musculoskeletal: No lower extremity edema   Neurologic:  Normal speech and language. No gross focal neurologic deficits are appreciated. Skin:  Skin is warm, dry and intact. No rash noted. Psychiatric: Mood and affect are normal. Speech and behavior are normal.    ____________________________________________   DIFFERENTIAL includes but not limited to  Retrocecal appendicitis, ovarian cyst, ovarian torsion, pelvic inflammatory disease, pyelonephritis, nephrolithiasis ____________________________________________   LABS (all labs ordered are listed, but only abnormal results are displayed)  Labs Reviewed  COMPREHENSIVE METABOLIC PANEL - Abnormal; Notable for the following components:      Result Value   Glucose, Bld 125 (*)    Calcium 8.6 (*)    All other components within normal limits  LIPASE, BLOOD  CBC WITH DIFFERENTIAL/PLATELET  HCG, QUANTITATIVE, PREGNANCY  URINALYSIS, COMPLETE (UACMP) WITH MICROSCOPIC    Lab work reviewed by me with no clear etiology of the patient's symptoms identified __________________________________________  EKG   ____________________________________________  RADIOLOGY  CT abdomen pelvis reviewed by me consistent with left-sided kidney stone at the UVJ ____________________________________________   PROCEDURES  Procedure(s) performed: no  Procedures  Critical Care performed: no  ____________________________________________   INITIAL IMPRESSION / ASSESSMENT AND PLAN / ED COURSE  Pertinent  labs & imaging results that were available during my care of the patient were reviewed by me and considered in my medical decision making (see chart for details).   As part of my medical decision making, I reviewed the following data within the electronic MEDICAL RECORD NUMBER History obtained from family if available, nursing notes, old chart and ekg, as well as notes from prior ED visits.  Patient arrives exquisitely uncomfortable appearing.  I do have some concern for ovarian torsion however her pain is constant and not colicky.  I initially gave her 50 mcg of fentanyl which essentially had no effect.  I subsequently gave her 8 mg of IV morphine and 2.5 mg of IV Haldol for pain and nausea which again had very minimal effect.  Every dose the Haldol and morphine and CT scan is pending.     ----------------------------------------- 7:42 AM on 05/10/2018 -----------------------------------------  The patient CT scan shows a small to moderate sized stone at the left UVJ.  Following ketorolac her symptoms are significantly improved.  It is too distal to benefit from Flomax.  I will discharge her home with Norco, ibuprofen, Zofran, and strict return precautions. ____________________________________________   FINAL CLINICAL IMPRESSION(S) /  ED DIAGNOSES  Final diagnoses:  Left lower quadrant pain  Renal colic      NEW MEDICATIONS STARTED DURING THIS VISIT:  New Prescriptions   HYDROCODONE-ACETAMINOPHEN (NORCO) 5-325 MG TABLET    Take 1 tablet by mouth every 6 (six) hours as needed for up to 15 doses for severe pain.   IBUPROFEN (ADVIL,MOTRIN) 600 MG TABLET    Take 1 tablet (600 mg total) by mouth every 8 (eight) hours as needed.   ONDANSETRON (ZOFRAN ODT) 4 MG DISINTEGRATING TABLET    Take 1 tablet (4 mg total) by mouth every 8 (eight) hours as needed for nausea or vomiting.     Note:  This document was prepared using Dragon voice recognition software and may include unintentional dictation  errors.     Merrily Brittleifenbark, Maelin Kurkowski, MD 05/10/18 [phone]

## 2018-05-10 NOTE — ED Notes (Signed)
AAOx3.  Skin warm and dry.  NAD 

## 2018-05-10 NOTE — ED Triage Notes (Signed)
Patient ambulatory to triage with steady gait, without difficulty, appears uncomfortable, grimacing; c/o left sided lateral pain, nonradiating accomp by nausea since last night; denies hx of same

## 2018-05-10 NOTE — Discharge Instructions (Signed)
It was a pleasure to take care of you today, and thank you for coming to our emergency department.  If you have any questions or concerns before leaving please ask the nurse to grab me and I'm more than happy to go through your aftercare instructions again.  If you were prescribed any opioid pain medication today such as Norco, Vicodin, Percocet, morphine, hydrocodone, or oxycodone please make sure you do not drive when you are taking this medication as it can alter your ability to drive safely.  If you have any concerns once you are home that you are not improving or are in fact getting worse before you can make it to your follow-up appointment, please do not hesitate to call [phone] and come back for further evaluation.  Madeline BrittleNeil Ash Mcelwain, MD  Results for orders placed or performed during the hospital encounter of 05/10/18  Comprehensive metabolic panel  Result Value Ref Range   Sodium 140 135 - 145 mmol/L   Potassium 3.6 3.5 - 5.1 mmol/L   Chloride 108 98 - 111 mmol/L   CO2 27 22 - 32 mmol/L   Glucose, Bld 125 (H) 70 - 99 mg/dL   BUN 9 6 - 20 mg/dL   Creatinine, Ser 1.610.58 0.44 - 1.00 mg/dL   Calcium 8.6 (L) 8.9 - 10.3 mg/dL   Total Protein 6.9 6.5 - 8.1 g/dL   Albumin 4.1 3.5 - 5.0 g/dL   AST 25 15 - 41 U/L   ALT 23 0 - 44 U/L   Alkaline Phosphatase 75 38 - 126 U/L   Total Bilirubin 0.7 0.3 - 1.2 mg/dL   GFR calc non Af Amer >60 >60 mL/min   GFR calc Af Amer >60 >60 mL/min   Anion gap 5 5 - 15  Lipase, blood  Result Value Ref Range   Lipase 24 11 - 51 U/L  CBC with Differential  Result Value Ref Range   WBC 7.9 3.6 - 11.0 K/uL   RBC 4.45 3.80 - 5.20 MIL/uL   Hemoglobin 13.4 12.0 - 16.0 g/dL   HCT 09.638.2 04.535.0 - 40.947.0 %   MCV 85.9 80.0 - 100.0 fL   MCH 30.1 26.0 - 34.0 pg   MCHC 35.0 32.0 - 36.0 g/dL   RDW 81.114.1 91.411.5 - 78.214.5 %   Platelets 336 150 - 440 K/uL   Neutrophils Relative % 61 %   Neutro Abs 5.0 1.4 - 6.5 K/uL   Lymphocytes Relative 29 %   Lymphs Abs 2.3 1.0 - 3.6 K/uL   Monocytes Relative 6 %   Monocytes Absolute 0.4 0.2 - 0.9 K/uL   Eosinophils Relative 3 %   Eosinophils Absolute 0.3 0 - 0.7 K/uL   Basophils Relative 1 %   Basophils Absolute 0.0 0 - 0.1 K/uL  hCG, quantitative, pregnancy  Result Value Ref Range   hCG, Beta Chain, Quant, S <1 <5 mIU/mL

## 2018-06-12 ENCOUNTER — Other Ambulatory Visit: Payer: Self-pay

## 2018-06-12 ENCOUNTER — Emergency Department
Admission: EM | Admit: 2018-06-12 | Discharge: 2018-06-12 | Disposition: A | Discharge status: Home / Self Care | Payer: BLUE CROSS/BLUE SHIELD | Attending: Emergency Medicine | Admitting: Emergency Medicine

## 2018-06-12 DIAGNOSIS — Z79899 Other long term (current) drug therapy: Secondary | ICD-10-CM | POA: Insufficient documentation

## 2018-06-12 DIAGNOSIS — R1011 Right upper quadrant pain: Secondary | ICD-10-CM | POA: Diagnosis present

## 2018-06-12 DIAGNOSIS — K8020 Calculus of gallbladder without cholecystitis without obstruction: Secondary | ICD-10-CM | POA: Diagnosis not present

## 2018-06-12 LAB — URINALYSIS, COMPLETE (UACMP) WITH MICROSCOPIC
BILIRUBIN URINE: NEGATIVE
Bacteria, UA: NONE SEEN
Glucose, UA: NEGATIVE mg/dL
Hgb urine dipstick: NEGATIVE
KETONES UR: NEGATIVE mg/dL
LEUKOCYTES UA: NEGATIVE
Nitrite: NEGATIVE
PH: 6 (ref 5.0–8.0)
PROTEIN: NEGATIVE mg/dL
Specific Gravity, Urine: 1.023 (ref 1.005–1.030)

## 2018-06-12 LAB — COMPREHENSIVE METABOLIC PANEL
ALBUMIN: 3.7 g/dL (ref 3.5–5.0)
ALT: 23 U/L (ref 0–44)
AST: 16 U/L (ref 15–41)
Alkaline Phosphatase: 70 U/L (ref 38–126)
Anion gap: 6 (ref 5–15)
BUN: 12 mg/dL (ref 6–20)
CHLORIDE: 106 mmol/L (ref 98–111)
CO2: 25 mmol/L (ref 22–32)
Calcium: 8.3 mg/dL — ABNORMAL LOW (ref 8.9–10.3)
Creatinine, Ser: 0.5 mg/dL (ref 0.44–1.00)
GFR calc Af Amer: >60 mL/min (ref >60)
GFR calc non Af Amer: >60 mL/min (ref >60)
GLUCOSE: 109 mg/dL — AB (ref 70–99)
POTASSIUM: 3.7 mmol/L (ref 3.5–5.1)
Sodium: 137 mmol/L (ref 135–145)
Total Bilirubin: 0.6 mg/dL (ref 0.3–1.2)
Total Protein: 6.5 g/dL (ref 6.5–8.1)

## 2018-06-12 LAB — CBC
HCT: 36.5 % (ref 35.0–47.0)
Hemoglobin: 13.1 g/dL (ref 12.0–16.0)
MCH: 30.9 pg (ref 26.0–34.0)
MCHC: 35.9 g/dL (ref 32.0–36.0)
MCV: 86.1 fL (ref 80.0–100.0)
PLATELETS: 313 10*3/uL (ref 150–440)
RBC: 4.24 MIL/uL (ref 3.80–5.20)
RDW: 14.1 % (ref 11.5–14.5)
WBC: 8.1 10*3/uL (ref 3.6–11.0)

## 2018-06-12 LAB — POCT PREGNANCY, URINE: Preg Test, Ur: NEGATIVE

## 2018-06-12 LAB — LIPASE, BLOOD: Lipase: 28 U/L (ref 11–51)

## 2018-06-12 MED ORDER — ONDANSETRON 4 MG PO TBDP: 4.0000 mg | ORAL_TABLET | Freq: Three times a day (TID) | ORAL | 0 refills | Status: DC | PRN

## 2018-06-12 MED ORDER — OXYCODONE-ACETAMINOPHEN 5-325 MG PO TABS: 1.0000 | ORAL_TABLET | ORAL | 0 refills | Status: AC | PRN

## 2018-06-12 NOTE — ED Triage Notes (Signed)
Pt in with co upper abd pain that started yesterday. Pt is scheduled to have gallbladder removed but told to come to ED for uncontrolled pain.

## 2018-06-12 NOTE — ED Provider Notes (Signed)
[facility] Emergency Department Provider Note    First MD Initiated Contact with Patient 06/12/18 [phone]     (approximate)  I have reviewed the triage vital signs and the nursing notes.   HISTORY  Chief Complaint Abdominal Pain    HPI Madeline Todd is a 27 y.o. female with history of kidney stones and gallstones presents to the emergency department current 4 out of 10 right upper quadrant abdominal pain which began yesterday with associated nausea.  Patient states pain was initially 10 out of 10 but is improved while being in the emergency department.  Patient denies any fever.  Patient denies any diarrhea or constipation.  Patient denies any urinary symptoms.  Patient states that she was notified by [facility] if pain was to worsen that she should come to the emergency department for pain control until cholecystectomy is performed.  Patient states that she was not prescribed any analgesic for home.   Past Medical History:  Diagnosis Date  . Obesity affecting pregnancy in second trimester 2016    Patient Active Problem List   Diagnosis Date Noted  . Normal labor and delivery 01/06/2016  . Obesity affecting pregnancy 12/02/2015  . Nausea and vomiting during pregnancy 11/02/2015    Past Surgical History:  Procedure Laterality Date  . CYSTECTOMY     vaginal cyst    Prior to Admission medications   Medication Sig Start Date End Date Taking? Authorizing Provider  docusate sodium (COLACE) 100 MG capsule Take 1 capsule (100 mg total) by mouth 2 (two) times daily. 01/08/16   Schermerhorn, Ihor Austin, MD  HYDROcodone-acetaminophen (NORCO) 5-325 MG tablet Take 1 tablet by mouth every 6 (six) hours as needed for up to 15 doses for severe pain. 05/10/18   Merrily Brittle, MD  ibuprofen (ADVIL,MOTRIN) 600 MG tablet Take 1 tablet (600 mg total) by mouth every 8 (eight) hours as needed. 05/10/18   Merrily Brittle, MD  meclizine (ANTIVERT) 25 MG tablet Take 1 tablet  (25 mg total) by mouth 3 (three) times daily as needed for dizziness. 09/15/17   Myrna Blazer, MD  ondansetron (ZOFRAN ODT) 4 MG disintegrating tablet Take 1 tablet (4 mg total) by mouth every 8 (eight) hours as needed for nausea or vomiting. 05/10/18   Merrily Brittle, MD  ondansetron (ZOFRAN) 4 MG tablet Take 1 tablet (4 mg total) by mouth daily as needed. 09/15/17   Myrna Blazer, MD  Prenatal Vit-Fe Fumarate-FA (PRENATAL MULTIVITAMIN) TABS tablet Take 1 tablet by mouth at bedtime.    [provider]  ranitidine (ZANTAC) 150 MG tablet Take 150 mg by mouth daily. Reported on 11/04/2015    [provider]    Allergies Shrimp [shellfish allergy]  No family history on file.  Social History Social History   Tobacco Use  . Smoking status: Never Smoker  . Smokeless tobacco: Never Used  Substance Use Topics  . Alcohol use: No  . Drug use: No    Review of Systems Constitutional: No fever/chills Eyes: No visual changes. ENT: No sore throat. Cardiovascular: Denies chest pain. Respiratory: Denies shortness of breath. Gastrointestinal: Positive for abdominal pain and nausea , no vomiting.  No diarrhea.  No constipation. Genitourinary: Negative for dysuria. Musculoskeletal: Negative for neck pain.  Negative for back pain. Integumentary: Negative for rash. Neurological: Negative for headaches, focal weakness or numbness.   ____________________________________________   PHYSICAL EXAM:  VITAL SIGNS: ED Triage Vitals  Enc Vitals Group     BP 06/12/18  0322 121/67     Pulse Rate 06/12/18 0322 77     Resp 06/12/18 0322 18     Temp 06/12/18 0322 98.3 F (36.8 C)     Temp Source 06/12/18 0322 Oral     SpO2 06/12/18 0322 97 %     Weight 06/12/18 0312 111.1 kg (245 lb)     Height 06/12/18 0312 1.524 m (5')     Head Circumference --      Peak Flow --      Pain Score 06/12/18 0312 7     Pain Loc --      Pain Edu? --      Excl. in GC? --      Constitutional: Alert and oriented. Well appearing and in no acute distress. Eyes: Conjunctivae are normal. Mouth/Throat: Mucous membranes are moist. Oropharynx non-erythematous. Neck: No stridor.   Cardiovascular: Normal rate, regular rhythm. Good peripheral circulation. Grossly normal heart sounds. Respiratory: Normal respiratory effort.  No retractions. Lungs CTAB. Gastrointestinal: Soft and nontender. No distention.  Musculoskeletal: No lower extremity tenderness nor edema. No gross deformities of extremities. Neurologic:  Normal speech and language. No gross focal neurologic deficits are appreciated.  Skin:  Skin is warm, dry and intact. No rash noted. Psychiatric: Mood and affect are normal. Speech and behavior are normal.  ____________________________________________   LABS (all labs ordered are listed, but only abnormal results are displayed)  Labs Reviewed  COMPREHENSIVE METABOLIC PANEL - Abnormal; Notable for the following components:      Result Value   Glucose, Bld 109 (*)    Calcium 8.3 (*)    All other components within normal limits  URINALYSIS, COMPLETE (UACMP) WITH MICROSCOPIC - Abnormal; Notable for the following components:   Color, Urine YELLOW (*)    APPearance CLEAR (*)    All other components within normal limits  CBC  LIPASE, BLOOD  POC URINE PREG, ED  POCT PREGNANCY, URINE       Procedures   ____________________________________________   INITIAL IMPRESSION / ASSESSMENT AND PLAN / ED COURSE  As part of my medical decision making, I reviewed the following data within the electronic MEDICAL RECORD NUMBER   27 year old female presenting with above-stated history and physical exam consistent with biliary colic without any evidence of cholecystitis or ascending cholangitis.  Patient will be prescribed Percocet and Zofran for home as needed with recommendation to follow-up for cholecystectomy as  planned. ____________________________________________  FINAL CLINICAL IMPRESSION(S) / ED DIAGNOSES  Final diagnoses:  Calculus of gallbladder without cholecystitis without obstruction     MEDICATIONS GIVEN DURING THIS VISIT:  Medications - No data to display   ED Discharge Orders    None       Note:  This document was prepared using Dragon voice recognition software and may include unintentional dictation errors.    Darci Current, MD 06/12/18 2227

## 2019-01-26 ENCOUNTER — Encounter: Payer: Self-pay | Admitting: Emergency Medicine

## 2019-01-26 ENCOUNTER — Other Ambulatory Visit: Payer: Self-pay

## 2019-01-26 ENCOUNTER — Emergency Department
Admission: EM | Admit: 2019-01-26 | Discharge: 2019-01-26 | Disposition: A | Discharge status: Home / Self Care | Payer: BC Managed Care – PPO | Attending: Emergency Medicine | Admitting: Emergency Medicine

## 2019-01-26 DIAGNOSIS — E860 Dehydration: Secondary | ICD-10-CM | POA: Diagnosis not present

## 2019-01-26 DIAGNOSIS — R5381 Other malaise: Secondary | ICD-10-CM | POA: Diagnosis not present

## 2019-01-26 DIAGNOSIS — R112 Nausea with vomiting, unspecified: Secondary | ICD-10-CM | POA: Diagnosis not present

## 2019-01-26 DIAGNOSIS — Z79899 Other long term (current) drug therapy: Secondary | ICD-10-CM | POA: Insufficient documentation

## 2019-01-26 LAB — COMPREHENSIVE METABOLIC PANEL
ALT: 21 U/L (ref 0–44)
AST: 21 U/L (ref 15–41)
Albumin: 4.1 g/dL (ref 3.5–5.0)
Alkaline Phosphatase: 97 U/L (ref 38–126)
Anion gap: 11 (ref 5–15)
BUN: 10 mg/dL (ref 6–20)
CO2: 24 mmol/L (ref 22–32)
Calcium: 8.6 mg/dL — ABNORMAL LOW (ref 8.9–10.3)
Chloride: 106 mmol/L (ref 98–111)
Creatinine, Ser: 0.5 mg/dL (ref 0.44–1.00)
GFR calc Af Amer: >60 mL/min (ref >60)
GFR calc non Af Amer: >60 mL/min (ref >60)
Glucose, Bld: 129 mg/dL — ABNORMAL HIGH (ref 70–99)
Potassium: 3.6 mmol/L (ref 3.5–5.1)
Sodium: 141 mmol/L (ref 135–145)
Total Bilirubin: 0.5 mg/dL (ref 0.3–1.2)
Total Protein: 7.7 g/dL (ref 6.5–8.1)

## 2019-01-26 LAB — URINALYSIS, COMPLETE (UACMP) WITH MICROSCOPIC
Bacteria, UA: NONE SEEN
Bilirubin Urine: NEGATIVE
Glucose, UA: NEGATIVE mg/dL
Hgb urine dipstick: NEGATIVE
Ketones, ur: 5 mg/dL — AB
Leukocytes,Ua: NEGATIVE
Nitrite: NEGATIVE
Protein, ur: 100 mg/dL — AB
Specific Gravity, Urine: 1.027 (ref 1.005–1.030)
pH: 7 (ref 5.0–8.0)

## 2019-01-26 LAB — CBC
HCT: 38.2 % (ref 36.0–46.0)
Hemoglobin: 13.2 g/dL (ref 12.0–15.0)
MCH: 29.2 pg (ref 26.0–34.0)
MCHC: 34.6 g/dL (ref 30.0–36.0)
MCV: 84.5 fL (ref 80.0–100.0)
Platelets: 378 10*3/uL (ref 150–400)
RBC: 4.52 MIL/uL (ref 3.87–5.11)
RDW: 13.6 % (ref 11.5–15.5)
WBC: 8.9 10*3/uL (ref 4.0–10.5)
nRBC: 0 % (ref 0.0–0.2)

## 2019-01-26 LAB — POCT PREGNANCY, URINE: Preg Test, Ur: NEGATIVE

## 2019-01-26 LAB — LIPASE, BLOOD: Lipase: 20 U/L (ref 11–51)

## 2019-01-26 MED ORDER — ONDANSETRON HCL 4 MG/2ML IJ SOLN
4.0000 mg | Freq: Once | INTRAMUSCULAR | Status: AC
  Administered 2019-01-26: 4 mg via INTRAVENOUS
  Filled 2019-01-26: qty 2

## 2019-01-26 MED ORDER — FAMOTIDINE 20 MG PO TABS: 20.0000 mg | ORAL_TABLET | Freq: Two times a day (BID) | ORAL | 0 refills | Status: DC

## 2019-01-26 MED ORDER — DEXTROSE-NACL 5-0.45 % IV SOLN
Freq: Once | INTRAVENOUS | Status: AC
  Administered 2019-01-26: 17:00:00 via INTRAVENOUS

## 2019-01-26 MED ORDER — PANTOPRAZOLE SODIUM 40 MG IV SOLR
40.0000 mg | Freq: Once | INTRAVENOUS | Status: AC
  Administered 2019-01-26: 40 mg via INTRAVENOUS
  Filled 2019-01-26: qty 40

## 2019-01-26 MED ORDER — SODIUM CHLORIDE 0.9% FLUSH: 3.0000 mL | Freq: Once | INTRAVENOUS | Status: DC

## 2019-01-26 MED ORDER — ONDANSETRON 4 MG PO TBDP: 4.0000 mg | ORAL_TABLET | Freq: Three times a day (TID) | ORAL | 0 refills | Status: DC | PRN

## 2019-01-26 NOTE — ED Notes (Signed)
Pt given crackers and cola for PO challenge.

## 2019-01-26 NOTE — ED Provider Notes (Signed)
[facility] Emergency Department Provider Note  ____________________________________________  Time seen: Approximately 6:24 PM  I have reviewed the triage vital signs and the nursing notes.   HISTORY  Chief Complaint Emesis    HPI Madeline Todd is a 28 y.o. female with no significant past medical history who complains of nausea vomiting and malaise that all started this morning when she woke up.  She notes that she drank 4 beers before going to bed last night and ate quesadillas for dinner.  She has not had anything to eat or drink today due to the malaise and nausea.  Symptoms are constant, waxing and waning without aggravating or alleviating factors.  Denies any significant abdominal pain.      Past Medical History:  Diagnosis Date  . Obesity affecting pregnancy in second trimester 2016     Patient Active Problem List   Diagnosis Date Noted  . Normal labor and delivery 01/06/2016  . Obesity affecting pregnancy 12/02/2015  . Nausea and vomiting during pregnancy 11/02/2015     Past Surgical History:  Procedure Laterality Date  . CYSTECTOMY     vaginal cyst     Prior to Admission medications   Medication Sig Start Date End Date Taking? Authorizing Provider  docusate sodium (COLACE) 100 MG capsule Take 1 capsule (100 mg total) by mouth 2 (two) times daily. 01/08/16   Schermerhorn, Ihor Austinhomas J, MD  famotidine (PEPCID) 20 MG tablet Take 1 tablet (20 mg total) by mouth 2 (two) times daily. 01/26/19   Sharman CheekStafford, Bobbe Quilter, MD  HYDROcodone-acetaminophen (NORCO) 5-325 MG tablet Take 1 tablet by mouth every 6 (six) hours as needed for up to 15 doses for severe pain. 05/10/18   Merrily Brittleifenbark, Neil, MD  ibuprofen (ADVIL,MOTRIN) 600 MG tablet Take 1 tablet (600 mg total) by mouth every 8 (eight) hours as needed. 05/10/18   Merrily Brittleifenbark, Neil, MD  meclizine (ANTIVERT) 25 MG tablet Take 1 tablet (25 mg total) by mouth 3 (three) times daily as needed for dizziness.  09/15/17   Myrna BlazerSchaevitz, David Matthew, MD  ondansetron (ZOFRAN ODT) 4 MG disintegrating tablet Take 1 tablet (4 mg total) by mouth every 8 (eight) hours as needed for nausea or vomiting. 01/26/19   Sharman CheekStafford, Matison Nuccio, MD  ondansetron (ZOFRAN) 4 MG tablet Take 1 tablet (4 mg total) by mouth daily as needed. 09/15/17   Schaevitz, Myra Rudeavid Matthew, MD  oxyCODONE-acetaminophen (PERCOCET) 5-325 MG tablet Take 1 tablet by mouth every 4 (four) hours as needed for severe pain. 06/12/18 06/12/19  Darci CurrentBrown, [place] N, MD  Prenatal Vit-Fe Fumarate-FA (PRENATAL MULTIVITAMIN) TABS tablet Take 1 tablet by mouth at bedtime.    [provider]  ranitidine (ZANTAC) 150 MG tablet Take 150 mg by mouth daily. Reported on 11/04/2015    [provider]     Allergies Shrimp [shellfish allergy]   No family history on file.  Social History Social History   Tobacco Use  . Smoking status: Never Smoker  . Smokeless tobacco: Never Used  Substance Use Topics  . Alcohol use: No  . Drug use: No    Review of Systems  Constitutional:   No fever or chills.  ENT:   No sore throat. No rhinorrhea. Cardiovascular:   No chest pain or syncope. Respiratory:   No dyspnea or cough. Gastrointestinal:   Negative for abdominal pain, positive vomiting Musculoskeletal:   Negative for focal pain or swelling All other systems reviewed and are negative except as documented above in ROS and HPI.  ____________________________________________   PHYSICAL EXAM:  VITAL SIGNS: ED Triage Vitals  Enc Vitals Group     BP 01/26/19 1256 (!) 117/42     Pulse Rate 01/26/19 1256 88     Resp 01/26/19 1730 18     Temp 01/26/19 1256 98.4 F (36.9 C)     Temp Source 01/26/19 1256 Oral     SpO2 01/26/19 1256 99 %     Weight 01/26/19 1257 243 lb (110.2 kg)     Height 01/26/19 1257 5' (1.524 m)     Head Circumference --      Peak Flow --      Pain Score 01/26/19 1301 0     Pain Loc --      Pain Edu? --      Excl. in GC? --      Vital signs reviewed, nursing assessments reviewed.   Constitutional:   Alert and oriented. Non-toxic appearance. Eyes:   Conjunctivae are normal. EOMI. PERRL. ENT      Head:   Normocephalic and atraumatic.      Nose:   No congestion/rhinnorhea.       Mouth/Throat:   MMM, no pharyngeal erythema. No peritonsillar mass.       Neck:   No meningismus. Full ROM. Hematological/Lymphatic/Immunilogical:   No cervical lymphadenopathy. Cardiovascular:   RRR. Symmetric bilateral radial and DP pulses.  No murmurs. Cap refill less than 2 seconds. Respiratory:   Normal respiratory effort without tachypnea/retractions. Breath sounds are clear and equal bilaterally. No wheezes/rales/rhonchi. Gastrointestinal:   Soft and nontender. Non distended. There is no CVA tenderness.  No rebound, rigidity, or guarding. Musculoskeletal:   Normal range of motion in all extremities. No joint effusions.  No lower extremity tenderness.  No edema. Neurologic:   Normal speech and language.  Motor grossly intact. No acute focal neurologic deficits are appreciated.  Skin:    Skin is warm, dry and intact. No rash noted.  No petechiae, purpura, or bullae.  ____________________________________________    LABS (pertinent positives/negatives) (all labs ordered are listed, but only abnormal results are displayed) Labs Reviewed  COMPREHENSIVE METABOLIC PANEL - Abnormal; Notable for the following components:      Result Value   Glucose, Bld 129 (*)    Calcium 8.6 (*)    All other components within normal limits  URINALYSIS, COMPLETE (UACMP) WITH MICROSCOPIC - Abnormal; Notable for the following components:   Color, Urine YELLOW (*)    APPearance HAZY (*)    Ketones, ur 5 (*)    Protein, ur 100 (*)    All other components within normal limits  LIPASE, BLOOD  CBC  POCT PREGNANCY, URINE  POC URINE PREG, ED   ____________________________________________   EKG    ____________________________________________     RADIOLOGY  No results found.  ____________________________________________   PROCEDURES Procedures  ____________________________________________    CLINICAL IMPRESSION / ASSESSMENT AND PLAN / ED COURSE  Medications ordered in the ED: Medications  sodium chloride flush (NS) 0.9 % injection 3 mL (has no administration in time range)  dextrose 5 %-0.45 % sodium chloride infusion ( Intravenous New Bag/Given 01/26/19 1722)  ondansetron (ZOFRAN) injection 4 mg (4 mg Intravenous Given 01/26/19 1725)  pantoprazole (PROTONIX) injection 40 mg (40 mg Intravenous Given 01/26/19 1725)    Pertinent labs & imaging results that were available during my care of the patient were reviewed by me and considered in my medical decision making (see chart for details).  Madeline Todd was evaluated  in Emergency Department on 01/26/2019 for the symptoms described in the history of present illness. She was evaluated in the context of the global COVID-19 pandemic, which necessitated consideration that the patient might be at risk for infection with the SARS-CoV-2 virus that causes COVID-19. Institutional protocols and algorithms that pertain to the evaluation of patients at risk for COVID-19 are in a state of rapid change based on information released by regulatory bodies including the CDC and federal and state organizations. These policies and algorithms were followed during the patient's care in the ED.   Patient presents with malaise and vomiting after a night of drinking.  Labs show slight ketosis, evidence of dehydration.  I will give IV fluids with D5 half-normal saline for hydration for essentially hangover.Considering the patient's symptoms, medical history, and physical examination today, I have low suspicion for cholecystitis or biliary pathology, pancreatitis, perforation or bowel obstruction, hernia, intra-abdominal abscess, AAA or dissection, volvulus or intussusception, mesenteric ischemia, or  appendicitis.    ----------------------------------------- 6:27 PM on 01/26/2019 -----------------------------------------  After medications and IV fluids, patient is feeling much better.  Tolerating oral intake.  Requests a work note.  Stable for discharge.      ____________________________________________   FINAL CLINICAL IMPRESSION(S) / ED DIAGNOSES    Final diagnoses:  Non-intractable vomiting with nausea, unspecified vomiting type  Dehydration     ED Discharge Orders         Ordered    famotidine (PEPCID) 20 MG tablet  2 times daily     01/26/19 1822    ondansetron (ZOFRAN ODT) 4 MG disintegrating tablet  Every 8 hours PRN     01/26/19 1822          Portions of this note were generated with dragon dictation software. Dictation errors may occur despite best attempts at proofreading.   Sharman Cheek, MD 01/26/19 [phone]

## 2019-01-26 NOTE — ED Triage Notes (Signed)
Pt ED via POV c/o N/V and dizziness. Pt states that her symptoms started this morning. Vomiting started before the dizziness. Pt denies sick contacts. Pt admits to excessive ETOH intake yesterday. Pt is in NAD.

## 2019-01-26 NOTE — ED Notes (Signed)
Pt verbalized understanding of discharge instructions. NAD at this time. 

## 2019-01-26 NOTE — ED Notes (Signed)
MD Stafford at bedside. 

## 2019-05-30 ENCOUNTER — Ambulatory Visit (LOCAL_COMMUNITY_HEALTH_CENTER): Payer: BC Managed Care – PPO | Admitting: Advanced Practice Midwife

## 2019-05-30 ENCOUNTER — Other Ambulatory Visit: Payer: Self-pay

## 2019-05-30 VITALS — BP 114/82 | Ht 60.5 in | Wt 248.0 lb

## 2019-05-30 DIAGNOSIS — Z3009 Encounter for other general counseling and advice on contraception: Secondary | ICD-10-CM

## 2019-05-30 DIAGNOSIS — Z3046 Encounter for surveillance of implantable subdermal contraceptive: Secondary | ICD-10-CM

## 2019-05-30 LAB — WET PREP FOR TRICH, YEAST, CLUE
Clue Cell Exam: NEGATIVE
Trichomonas Exam: NEGATIVE
Yeast Exam: NEGATIVE

## 2019-05-30 NOTE — Addendum Note (Signed)
Addended by: Donnal Moat on: 05/30/2019 05:14 PM   Modules accepted: Orders

## 2019-05-30 NOTE — Progress Notes (Signed)
[place] problem visit  [place] Department  Subjective:  Madeline Todd is a 28 y.o. G3P3 exsmoker being seen today for Nexplanon removal, pap  Chief Complaint  Patient presents with  . Contraception    desires nexplanon removal, plans to use condoms    HPI Last pap 2015 wnl.  Nexplanon inserted 03/07/2016.  LMP 02/2016.  Last sex 05/28/19 without condom. Pt counseled on increased risk of conception with unprotected coitus 2 days ago with Nexplanon removal and pt ok with that and declines ECP or any birth control today.  Exsmoker with last use 6 years ago  Does the patient have a current or past history of drug use? No   No components found for: HCV]   Health Maintenance Due  Topic Date Due  . TETANUS/TDAP  01/22/2009  . PAP-Cervical Cytology Screening  01/23/2011  . PAP SMEAR-Modifier  01/23/2011  . INFLUENZA VACCINE  04/12/2019    ROS  The following portions of the patient's history were reviewed and updated as appropriate: allergies, current medications, past family history, past medical history, past social history, past surgical history and problem list. Problem list updated.   See flowsheet for other program required questions.  Objective:   Vitals:   05/30/19 0837  BP: 114/82  Weight: 248 lb (112.5 kg)  Height: 5' 0.5" (1.537 m)    Physical Exam Genitourinary:    General: Normal vulva.     Exam position: Lithotomy position.     Labia:        Right: No rash or lesion.        Left: No rash or lesion.      Vagina: Vaginal discharge (sl red menses blood, ph>4.5) present.     Cervix: Normal.     Uterus: Normal.      Adnexa: Left adnexa normal.     Rectum: Normal.     Comments: Pap done      Assessment and Plan:  Madeline Todd is a 28 y.o. female presenting to the [facility] Department for a Women's Health problem visit  1. Nexplanon removal Nexplanon Removal Patient identified, informed  consent performed, consent signed.   Appropriate time out taken. Nexplanon site identified in left arm.  Area prepped in usual sterile fashon. 3 ml of 1% lidocaine with Epinephrine was used to anesthetize the area at the distal end of the implant and along implant site. A small stab incision was made right beside the implant on the distal portion.  The Nexplanon rod was grasped using straight hemostats/manual and removed without difficulty.  There was minimal blood loss. There were no complications.  Steri-strips were applied over the small incision.  A pressure bandage was applied to reduce any bruising.  The patient tolerated the procedure well and was given post procedure instructions.   Nexplanon:   Counseled patient to take OTC analgesic starting as soon as lidocaine starts to wear off and take regularly for at least 48 hr to decrease discomfort.  Specifically to take with food or milk to decrease stomach upset and for IB 600 mg (3 tablets) every 6 hrs; IB 800 mg (4 tablets) every 8 hrs; or Aleve 2 tablets every 12 hrs.    2. Family planning Pap done today.  Last pap 2015 wnl.  GC/Chlamydia, wet mount done Treat wet mount per standing orders - IGP, Aptima HPV - Chlamydia/Gonorrhea [place] [facility] - WET PREP FOR [place], YEAST, CLUE  Return if symptoms worsen or fail to improve.  No future appointments.  Alberteen Spindle, CNM

## 2019-05-30 NOTE — Progress Notes (Signed)
Wet mount reviewed, no tx per standing order. Provider orders completed. 

## 2019-05-30 NOTE — Progress Notes (Signed)
Last physical at [facility] 02/17/2016; and 02/17/2014 PAP normal per [facility] records. Nexplanon inserted by [facility] on 03/07/2016. Pt states last PAP was at [facility].

## 2019-06-04 LAB — IGP, RFX APTIMA HPV ASCU: PAP Smear Comment: 0

## 2019-06-17 NOTE — Addendum Note (Signed)
Addended by: Cletis Media on: 06/17/2019 01:53 PM   Modules accepted: Orders

## 2019-09-29 ENCOUNTER — Other Ambulatory Visit: Payer: BC Managed Care – PPO

## 2019-10-01 ENCOUNTER — Emergency Department: Payer: Self-pay

## 2019-10-01 ENCOUNTER — Other Ambulatory Visit: Payer: Self-pay

## 2019-10-01 ENCOUNTER — Emergency Department
Admission: EM | Admit: 2019-10-01 | Discharge: 2019-10-01 | Disposition: A | Discharge status: Home / Self Care | Payer: Self-pay | Attending: Emergency Medicine | Admitting: Emergency Medicine

## 2019-10-01 ENCOUNTER — Encounter: Payer: Self-pay | Admitting: Emergency Medicine

## 2019-10-01 DIAGNOSIS — M545 Low back pain, unspecified: Secondary | ICD-10-CM

## 2019-10-01 DIAGNOSIS — R1031 Right lower quadrant pain: Secondary | ICD-10-CM | POA: Insufficient documentation

## 2019-10-01 DIAGNOSIS — Z79899 Other long term (current) drug therapy: Secondary | ICD-10-CM | POA: Insufficient documentation

## 2019-10-01 DIAGNOSIS — R110 Nausea: Secondary | ICD-10-CM | POA: Insufficient documentation

## 2019-10-01 LAB — URINALYSIS, COMPLETE (UACMP) WITH MICROSCOPIC
Bacteria, UA: NONE SEEN
Bilirubin Urine: NEGATIVE
Glucose, UA: NEGATIVE mg/dL
Hgb urine dipstick: NEGATIVE
Ketones, ur: NEGATIVE mg/dL
Nitrite: NEGATIVE
Protein, ur: NEGATIVE mg/dL
Specific Gravity, Urine: >1.046 — ABNORMAL HIGH (ref 1.005–1.030)
pH: 6 (ref 5.0–8.0)

## 2019-10-01 LAB — COMPREHENSIVE METABOLIC PANEL
ALT: 58 U/L — ABNORMAL HIGH (ref 0–44)
AST: 55 U/L — ABNORMAL HIGH (ref 15–41)
Albumin: 3.7 g/dL (ref 3.5–5.0)
Alkaline Phosphatase: 89 U/L (ref 38–126)
Anion gap: 8 (ref 5–15)
BUN: 10 mg/dL (ref 6–20)
CO2: 25 mmol/L (ref 22–32)
Calcium: 8.3 mg/dL — ABNORMAL LOW (ref 8.9–10.3)
Chloride: 107 mmol/L (ref 98–111)
Creatinine, Ser: 0.5 mg/dL (ref 0.44–1.00)
GFR calc Af Amer: >60 mL/min (ref >60)
GFR calc non Af Amer: >60 mL/min (ref >60)
Glucose, Bld: 109 mg/dL — ABNORMAL HIGH (ref 70–99)
Potassium: 3.5 mmol/L (ref 3.5–5.1)
Sodium: 140 mmol/L (ref 135–145)
Total Bilirubin: 0.6 mg/dL (ref 0.3–1.2)
Total Protein: 6.9 g/dL (ref 6.5–8.1)

## 2019-10-01 LAB — CBC WITH DIFFERENTIAL/PLATELET
Abs Immature Granulocytes: 0.02 10*3/uL (ref 0.00–0.07)
Basophils Absolute: 0 10*3/uL (ref 0.0–0.1)
Basophils Relative: 0 %
Eosinophils Absolute: 0 10*3/uL (ref 0.0–0.5)
Eosinophils Relative: 1 %
HCT: 36.8 % (ref 36.0–46.0)
Hemoglobin: 12.6 g/dL (ref 12.0–15.0)
Immature Granulocytes: 1 %
Lymphocytes Relative: 25 %
Lymphs Abs: 1.1 10*3/uL (ref 0.7–4.0)
MCH: 29.2 pg (ref 26.0–34.0)
MCHC: 34.2 g/dL (ref 30.0–36.0)
MCV: 85.2 fL (ref 80.0–100.0)
Monocytes Absolute: 0.3 10*3/uL (ref 0.1–1.0)
Monocytes Relative: 7 %
Neutro Abs: 3 10*3/uL (ref 1.7–7.7)
Neutrophils Relative %: 66 %
Platelets: 247 10*3/uL (ref 150–400)
RBC: 4.32 MIL/uL (ref 3.87–5.11)
RDW: 13.2 % (ref 11.5–15.5)
WBC: 4.4 10*3/uL (ref 4.0–10.5)
nRBC: 0 % (ref 0.0–0.2)

## 2019-10-01 LAB — POCT PREGNANCY, URINE: Preg Test, Ur: NEGATIVE

## 2019-10-01 LAB — LIPASE, BLOOD: Lipase: 20 U/L (ref 11–51)

## 2019-10-01 MED ORDER — MORPHINE SULFATE (PF) 2 MG/ML IV SOLN
2.0000 mg | Freq: Once | INTRAVENOUS | Status: AC
  Administered 2019-10-01: 05:00:00 2 mg via INTRAVENOUS
  Filled 2019-10-01: qty 1

## 2019-10-01 MED ORDER — CYCLOBENZAPRINE HCL 10 MG PO TABS: 10.0000 mg | ORAL_TABLET | Freq: Three times a day (TID) | ORAL | 0 refills | Status: DC | PRN

## 2019-10-01 MED ORDER — IOHEXOL 300 MG/ML  SOLN
100.0000 mL | Freq: Once | INTRAMUSCULAR | Status: AC | PRN
  Administered 2019-10-01: 100 mL via INTRAVENOUS

## 2019-10-01 MED ORDER — ONDANSETRON HCL 4 MG/2ML IJ SOLN
4.0000 mg | Freq: Once | INTRAMUSCULAR | Status: AC
  Administered 2019-10-01: 4 mg via INTRAVENOUS
  Filled 2019-10-01: qty 2

## 2019-10-01 NOTE — Discharge Instructions (Addendum)
Your liver function tests were slightly elevated.  You should follow this up with your primary doctor.  Unlikely to be the cause of your pain today but something that will need to be rechecked in the next month.  You take 1 g of Tylenol every 8 hours and 400 of ibuprofen every 6 hours to help with your pain.  Return to the ER for any other concerns.

## 2019-10-01 NOTE — ED Provider Notes (Signed)
7:44 AM Assumed care for off going team.   Blood pressure 110/67, pulse 70, resp. rate 19, height 5' (1.524 m), weight 112 kg, last menstrual period 09/24/2019, SpO2 97 %.  See their HPI for full report but in brief workup negative but pending UA and d/c.   Patient is UA not indicative of UTI given there was equal amount of squamous cells as WBCs.  Patient also denies any urinary symptoms.  Her liver function tests were slightly elevated from baseline and she can follow that up with her primary doctor.  Her CT scan had showed concerns for may be some glass opacities but she had no respiratory symptoms per off going team did not want to treat her for pneumonia.  I reevaluated patient updated on the plan.  I offered her pelvic exam to evaluate for PID, STDs but patient declined stating she monogamous since she was 42.  She was told by Dr. Manson Passey that she would be getting a prescription for Flexeril.  We discussed not using this while driving or while working.  We will give her a short course although I discussed symptomatic treatment with Tylenol and ibuprofen as well.  Patient feels comfortable with discharge home at this time  I discussed the provisional nature of ED diagnosis, the treatment so far, the ongoing plan of care, follow up appointments and return precautions with the patient and any family or support people present. They expressed understanding and agreed with the plan, discharged home.             [name], MD 10/01/19 [phone]

## 2019-10-01 NOTE — ED Notes (Signed)
Pt taken to CT.

## 2019-10-01 NOTE — ED Provider Notes (Signed)
[facility] Emergency Department Provider Note  ____________________________________________   First MD Initiated Contact with Patient 10/01/19 [phone]     (approximate)  I have reviewed the triage vital signs and the nursing notes.   HISTORY  Chief Complaint Back Pain and Abdominal Pain    HPI Madeline Todd is a 29 y.o. female with below list of previous medical conditions including cholecystectomy and kidney stones presents to the emergency department secondary to current 8 out of 10 right lower quadrant with radiation to the back x2 days.  Patient does admit to nausea however no vomiting.  Patient denies any diarrhea constipation.  Patient denies any fever.        Past Medical History:  Diagnosis Date  . Obesity affecting pregnancy in second trimester 2016    There are no problems to display for this patient.   Past Surgical History:  Procedure Laterality Date  . CHOLECYSTECTOMY    . CYSTECTOMY     vaginal cyst    Prior to Admission medications   Medication Sig Start Date End Date Taking? Authorizing Provider  cyclobenzaprine (FLEXERIL) 10 MG tablet Take 1 tablet (10 mg total) by mouth 3 (three) times daily as needed. 10/01/19   Darci Current, MD  docusate sodium (COLACE) 100 MG capsule Take 1 capsule (100 mg total) by mouth 2 (two) times daily. Patient not taking: Reported on 05/30/2019 01/08/16   Schermerhorn, Ihor Austin, MD  famotidine (PEPCID) 20 MG tablet Take 1 tablet (20 mg total) by mouth 2 (two) times daily. Patient not taking: Reported on 05/30/2019 01/26/19   Sharman Cheek, MD  HYDROcodone-acetaminophen [facility]) 5-325 MG tablet Take 1 tablet by mouth every 6 (six) hours as needed for up to 15 doses for severe pain. Patient not taking: Reported on 05/30/2019 05/10/18   Merrily Brittle, MD  ibuprofen (ADVIL,MOTRIN) 600 MG tablet Take 1 tablet (600 mg total) by mouth every 8 (eight) hours as needed. Patient not taking: Reported  on 05/30/2019 05/10/18   Merrily Brittle, MD  meclizine (ANTIVERT) 25 MG tablet Take 1 tablet (25 mg total) by mouth 3 (three) times daily as needed for dizziness. Patient not taking: Reported on 05/30/2019 09/15/17   Myrna Blazer, MD  Multiple Vitamins-Minerals (WOMENS MULTI) CAPS Take 1 tablet by mouth 1 day or 1 dose.    [provider]  ondansetron (ZOFRAN ODT) 4 MG disintegrating tablet Take 1 tablet (4 mg total) by mouth every 8 (eight) hours as needed for nausea or vomiting. Patient not taking: Reported on 05/30/2019 01/26/19   Sharman Cheek, MD  ondansetron (ZOFRAN) 4 MG tablet Take 1 tablet (4 mg total) by mouth daily as needed. Patient not taking: Reported on 05/30/2019 09/15/17   Myrna Blazer, MD  Prenatal Vit-Fe Fumarate-FA (PRENATAL MULTIVITAMIN) TABS tablet Take 1 tablet by mouth at bedtime.    [provider]  ranitidine (ZANTAC) 150 MG tablet Take 150 mg by mouth daily. Reported on 11/04/2015    [provider]    Allergies Shrimp [shellfish allergy]  No family history on file.  Social History Social History   Tobacco Use  . Smoking status: Never Smoker  . Smokeless tobacco: Never Used  Substance Use Topics  . Alcohol use: No  . Drug use: No    Review of Systems Constitutional: No fever/chills Eyes: No visual changes. ENT: No sore throat. Cardiovascular: Denies chest pain. Respiratory: Denies shortness of breath. Gastrointestinal: Positive for abdominal pain and nausea no vomiting.  No diarrhea.  No constipation. Genitourinary: Negative for dysuria. Musculoskeletal: Negative for neck pain.  Negative for back pain. Integumentary: Negative for rash. Neurological: Negative for headaches, focal weakness or numbness.   ____________________________________________   PHYSICAL EXAM:  VITAL SIGNS: ED Triage Vitals [10/01/19 0447]  Enc Vitals Group     BP      Pulse      Resp      Temp      Temp src      SpO2       Weight 112 kg (247 lb)     Height 1.524 m (5')     Head Circumference      Peak Flow      Pain Score 8     Pain Loc      Pain Edu?      Excl. in GC?     Constitutional: Alert and oriented.  Eyes: Conjunctivae are normal.  Mouth/Throat: Patient is wearing a mask. Neck: No stridor.  No meningeal signs.   Cardiovascular: Normal rate, regular rhythm. Good peripheral circulation. Grossly normal heart sounds. Respiratory: Normal respiratory effort.  No retractions. Gastrointestinal: Soft and nontender. No distention.  Musculoskeletal: No lower extremity tenderness nor edema. No gross deformities of extremities. Neurologic:  Normal speech and language. No gross focal neurologic deficits are appreciated.  Skin:  Skin is warm, dry and intact. Psychiatric: Mood and affect are normal. Speech and behavior are normal.  ____________________________________________   LABS (all labs ordered are listed, but only abnormal results are displayed)  Labs Reviewed  COMPREHENSIVE METABOLIC PANEL - Abnormal; Notable for the following components:      Result Value   Glucose, Bld 109 (*)    Calcium 8.3 (*)    AST 55 (*)    ALT 58 (*)    All other components within normal limits  URINALYSIS, COMPLETE (UACMP) WITH MICROSCOPIC - Abnormal; Notable for the following components:   Color, Urine YELLOW (*)    APPearance CLEAR (*)    Specific Gravity, Urine >1.046 (*)    Leukocytes,Ua TRACE (*)    All other components within normal limits  CBC WITH DIFFERENTIAL/PLATELET  LIPASE, BLOOD  POCT PREGNANCY, URINE     RADIOLOGY I, Preston N Jeptha Hinnenkamp, personally viewed and evaluated these images (plain radiographs) as part of my medical decision making, as well as reviewing the written report by the radiologist.  ED MD interpretation: CT abdomen pelvis revealed no acute intra-abdominal pathology.  Artifact versus groundglass opacities at the base of the lungs bilaterally per radiologist.  Official radiology  report(s): CT ABDOMEN PELVIS W CONTRAST  Result Date: 10/01/2019 CLINICAL DATA:  Low back and abdominal pain for 2 days. EXAM: CT ABDOMEN AND PELVIS WITH CONTRAST TECHNIQUE: Multidetector CT imaging of the abdomen and pelvis was performed using the standard protocol following bolus administration of intravenous contrast. CONTRAST:  OMNIPAQUE IOHEXOL 300 MG/ML  SOLN COMPARISON:  05/10/2018 FINDINGS: Lower chest: Motion artifact in the lung bases with suggestion of partially imaged small ground glass opacities bilaterally. No pleural effusion. Hepatobiliary: No focal liver abnormality is seen. Status post interval cholecystectomy. No biliary dilatation. Pancreas: Unremarkable. Spleen: Unremarkable. Adrenals/Urinary Tract: Unremarkable adrenal glands. No evidence of renal mass, calculi, or hydronephrosis. Unremarkable bladder. Stomach/Bowel: The stomach is unremarkable. There is no evidence of bowel obstruction or inflammation. The appendix is unremarkable. Vascular/Lymphatic: No significant vascular findings are present. No enlarged abdominal or pelvic lymph nodes. Reproductive: Uterus and bilateral adnexa are unremarkable. Other: No intraperitoneal free fluid.  Small fat containing umbilical hernia. Musculoskeletal: No acute osseous abnormality or suspicious osseous lesion. IMPRESSION: 1. No acute abnormality identified in the abdomen or pelvis. 2. Partially imaged small ground glass opacities in the lung bases, possibly infectious/inflammatory with assessment limited by motion. Consider further evaluation with chest radiographs. Electronically Signed   By: Logan Bores M.D.   On: 10/01/2019 06:31      Procedures   ____________________________________________   INITIAL IMPRESSION / MDM / [place] / ED COURSE  As part of my medical decision making, I reviewed the following data within the electronic MEDICAL RECORD NUMBER  29 year old female presented with above-stated history and physical  exam a differential diagnosis including appendicitis ureterolithiasis ovarian cyst versus skill skeletal etiology.  CT abdomen pelvis performed which revealed no acute intra-abdominal pathology.  Regarding questionable motion artifact versus groundglass opacities at the base of the lung the patient has no respiratory symptoms and as such I suspect this to be motion artifact.  Patient awaiting urinalysis at this time I anticipate that the patient will be able to be discharged home.  ____________________________________________  FINAL CLINICAL IMPRESSION(S) / ED DIAGNOSES  Final diagnoses:  Acute right-sided low back pain without sciatica     MEDICATIONS GIVEN DURING THIS VISIT:  Medications  morphine 2 MG/ML injection 2 mg (2 mg Intravenous Given 10/01/19 0506)  ondansetron (ZOFRAN) injection 4 mg (4 mg Intravenous Given 10/01/19 0506)  iohexol (OMNIPAQUE) 300 MG/ML solution 100 mL (100 mLs Intravenous Contrast Given 10/01/19 0603)     ED Discharge Orders         Ordered    cyclobenzaprine (FLEXERIL) 10 MG tablet  3 times daily PRN     10/01/19 9622          *Please note:  Jiah Bari was evaluated in Emergency Department on 10/01/2019 for the symptoms described in the history of present illness. She was evaluated in the context of the global COVID-19 pandemic, which necessitated consideration that the patient might be at risk for infection with the SARS-CoV-2 virus that causes COVID-19. Institutional protocols and algorithms that pertain to the evaluation of patients at risk for COVID-19 are in a state of rapid change based on information released by regulatory bodies including the CDC and federal and state organizations. These policies and algorithms were followed during the patient's care in the ED.  Some ED evaluations and interventions may be delayed as a result of limited staffing during the pandemic.*  Note:  This document was prepared using Dragon voice recognition  software and may include unintentional dictation errors.   Gregor Hams, MD 10/01/19 [phone]

## 2019-10-01 NOTE — ED Triage Notes (Signed)
Patient ambulatory to triage with steady gait, without difficulty or distress noted, mask in place; pt reports lower back pain radiating around in abd x 2 days with no accomp symptoms

## 2020-03-02 ENCOUNTER — Ambulatory Visit (LOCAL_COMMUNITY_HEALTH_CENTER): Payer: Medicaid Other | Admitting: Physician Assistant

## 2020-03-02 ENCOUNTER — Encounter: Payer: Self-pay | Admitting: Physician Assistant

## 2020-03-02 ENCOUNTER — Other Ambulatory Visit: Payer: Self-pay

## 2020-03-02 VITALS — BP 113/73 | Ht 60.0 in | Wt 236.8 lb

## 2020-03-02 DIAGNOSIS — Z0000 Encounter for general adult medical examination without abnormal findings: Secondary | ICD-10-CM

## 2020-03-02 DIAGNOSIS — Z30012 Encounter for prescription of emergency contraception: Secondary | ICD-10-CM

## 2020-03-02 DIAGNOSIS — Z3009 Encounter for other general counseling and advice on contraception: Secondary | ICD-10-CM

## 2020-03-02 DIAGNOSIS — Z30013 Encounter for initial prescription of injectable contraceptive: Secondary | ICD-10-CM | POA: Diagnosis not present

## 2020-03-02 DIAGNOSIS — Z113 Encounter for screening for infections with a predominantly sexual mode of transmission: Secondary | ICD-10-CM

## 2020-03-02 LAB — WET PREP FOR TRICH, YEAST, CLUE
Trichomonas Exam: NEGATIVE
Yeast Exam: NEGATIVE

## 2020-03-02 LAB — PREGNANCY, URINE: Preg Test, Ur: NEGATIVE

## 2020-03-02 MED ORDER — LEVONORGESTREL 1.5 MG PO TABS: 1.5000 mg | ORAL_TABLET | Freq: Once | ORAL | 0 refills | Status: AC

## 2020-03-02 MED ORDER — LEVONORGESTREL 1.5 MG PO TABS: 1.5000 mg | ORAL_TABLET | Freq: Once | ORAL | Status: DC

## 2020-03-02 MED ORDER — MEDROXYPROGESTERONE ACETATE 150 MG/ML IM SUSP
150.0000 mg | INTRAMUSCULAR | Status: AC
  Administered 2020-03-02 – 2021-01-05 (×4): 150 mg via INTRAMUSCULAR

## 2020-03-02 MED ORDER — THERA VITAL M PO TABS: 1.0000 | ORAL_TABLET | Freq: Every day | ORAL | 0 refills | Status: DC

## 2020-03-02 NOTE — Progress Notes (Signed)
Pt here for physical and to start on Depo. Pt reports that in May 2021 she had 2 periods that were heavy. LMP ~01/29/2020. Pt reports last sex was 02/29/2020 without condom. Pt reports other times in the past 2 weeks that were without condoms. Pt desires STD screening and blood work today.

## 2020-03-02 NOTE — Progress Notes (Signed)
UPT is negative today, wet mount reviewed and is negative today, so no treatment needed for wet mount per standing order. Pt received Depo 150mg  IM today per provider order and pt tolerated well. Consulted with provider as pt has medicaid, and per , Madeline Todd verbal order ok to give pt Plan B from our supply here. Pt received Plan B per provider order. ECP instructions reviewed with pt and RN counseling completed and consent forms for ECP and Depo signed by pt. Pt received MVI's per pt request. Counseled pt per [place], PA verbal order for pt to do an OTC UPT at home in 2 weeks from today and if it is positive to let Madeline Todd know so she can RTC, and pt states understanding. Provider orders completed.

## 2020-03-03 ENCOUNTER — Encounter: Payer: Self-pay | Admitting: Physician Assistant

## 2020-03-03 NOTE — Progress Notes (Signed)
Family Planning Visit- Repeat Yearly Visit  Subjective:  Madeline Todd is a 29 y.o. [phone]  being seen today for an well woman visit and to discuss family planning options.    She is currently using none for pregnancy prevention. Patient reports she does not want  a pregnancy in the next year. Patient  does not have any active problems on file.  Chief Complaint  Patient presents with  . Contraception    Physical exam and start Depo    Patient reports that she desires to start Depo. States that she has used Depo in the past without problems.  Reports that she has been exercising more to lose weight.  Reports that she has had some nausea and dizziness lately but has been exercising without eating prior to exercising.  Also, states she has had am headaches.  Reports that she eats at 11 am and between 6-7 pm.  Patient denies any changes to family and personal history.    See flowsheet for other program required questions.   Body mass index is 46.25 kg/m. - Patient is eligible for diabetes screening based on BMI and age >66?  not applicable HA1C ordered? not applicable  Patient reports 1 of partners in last year. Desires STI screening?  Yes   Has patient been screened once for HCV in the past?  No  No results found for: HCVAB  Does the patient have current of drug use, have a partner with drug use, and/or has been incarcerated since last result? No  If yes-- Screen for HCV through [facility]   Does the patient meet criteria for HBV testing? No  Criteria:  -Household, sexual or needle sharing contact with HBV -History of drug use -HIV positive -Those with known Hep C   Health Maintenance Due  Topic Date Due  . Hepatitis C Screening  Never done  . COVID-19 Vaccine (1) Never done  . TETANUS/TDAP  Never done    Review of Systems  All other systems reviewed and are negative.   The following portions of the patient's history were reviewed and updated as  appropriate: allergies, current medications, past family history, past medical history, past social history, past surgical history and problem list. Problem list updated.  Objective:   Vitals:   03/02/20 1451  BP: 113/73  Weight: 236 lb 12.8 oz (107.4 kg)  Height: 5' (1.524 m)    Physical Exam Vitals and nursing note reviewed.  Constitutional:      General: She is not in acute distress.    Appearance: Normal appearance.  HENT:     Head: Normocephalic and atraumatic.  Eyes:     Conjunctiva/sclera: Conjunctivae normal.  Neck:     Thyroid: No thyroid mass, thyromegaly or thyroid tenderness.  Cardiovascular:     Rate and Rhythm: Normal rate and regular rhythm.  Pulmonary:     Effort: Pulmonary effort is normal.     Breath sounds: Normal breath sounds.  Chest:     Breasts:        Right: Normal.        Left: Normal.  Abdominal:     Palpations: Abdomen is soft. There is no mass.     Tenderness: There is no abdominal tenderness. There is no guarding or rebound.  Genitourinary:    General: Normal vulva.     Rectum: Normal.     Comments: External genitalia/pubic area without nits, lice, edema, erythema, lesions and inguinal adenopathy. Vagina with normal mucosa and discharge.  Cervix without visible lesions. Uterus firm, mobile, nt, no masses, no CMT, no adnexal tenderness or fullness. Musculoskeletal:     Cervical back: Neck supple.  Lymphadenopathy:     Upper Body:     Right upper body: No supraclavicular, axillary or pectoral adenopathy.     Left upper body: No supraclavicular, axillary or pectoral adenopathy.  Skin:    General: Skin is warm and dry.     Findings: No bruising, erythema, lesion or rash.  Neurological:     Mental Status: She is alert and oriented to person, place, and time.  Psychiatric:        Mood and Affect: Mood normal.        Behavior: Behavior normal.        Thought Content: Thought content normal.        Judgment: Judgment normal.        Assessment and Plan:  Madeline Todd is a 29 y.o. female [phone] presenting to the [facility] Department for an yearly well woman exam/family planning visit  Contraception counseling: Reviewed all forms of birth control options in the tiered based approach. available including abstinence; over the counter/barrier methods; hormonal contraceptive medication including pill, patch, ring, injection,contraceptive implant, ECP; hormonal and nonhormonal IUDs; permanent sterilization options including vasectomy and the various tubal sterilization modalities. Risks, benefits, and typical effectiveness rates were reviewed.  Questions were answered.  Written information was also given to the patient to review.  Patient desires Depo, this was prescribed for patient. She will follow up in  3 months and prn for surveillance.  She was told to call with any further questions, or with any concerns about this method of contraception.  Emphasized use of condoms 100% of the time for STI prevention.  Patient was offered ECP. ECP was accepted by the patient. ECP counseling was given - see RN documentation    1. Encounter for counseling regarding contraception Patient counseled re:  Risks, benefits, and SE of Depo specifically. Rec condoms with all sex for 2 weeks after shot today, do OTC pregnancy test in 2 weeks and RTC if positive. If patient does not do OTC pregnancy test at home, will need a negative pregnancy test prior to getting next Depo. - Pregnancy, urine - Multiple Vitamins-Minerals (MULTIVITAMIN) tablet; Take 1 tablet by mouth daily.  Dispense: 100 tablet; Refill: 0  2. Screening for STD (sexually transmitted disease) Await test results.  Counseled that RN will call if needs to RTC for treatment once results are back.  - WET PREP FOR TRICH, YEAST, CLUE - Chlamydia/Gonorrhea [place] [facility] - HIV [place] [facility] - Syphilis Serology, [place] [facility]  3. Well woman exam (no  gynecological exam) Reviewed with patient healthy habits for weight control and maintanence. Rec patient eat 4-5 small meals per day for blood sugar control and drink plenty of water, especially during exercise. Enc MVI 1 po daily. Enc to establish with/follow up with PCP for primary care concerns, illness and age appropriate screening.  4. Initiation of Depo Provera If PT is negative OK to start Depo 150mg  IM q 11-13 weeks for 1 year. - medroxyPROGESTERone (DEPO-PROVERA) injection 150 mg  5. Encounter for emergency contraceptive counseling and prescription OK to given Plan B 1 po ASAP today and give Depo. Patient to use condoms and recheck PT in 2 weeks.  - levonorgestrel (PLAN B ONE-STEP) 1.5 MG tablet; Take 1 tablet (1.5 mg total) by mouth once for 1 dose.  Dispense: 1 tablet;  Refill: 0     Return in about 11 weeks (around 05/18/2020) for depo ( if did not do PT at home, will need negative one prior to Depo in 05/2020)annual, and PRN.  No future appointments.  Jerene Dilling, PA

## 2020-05-18 ENCOUNTER — Other Ambulatory Visit: Payer: Self-pay

## 2020-05-18 ENCOUNTER — Ambulatory Visit (LOCAL_COMMUNITY_HEALTH_CENTER): Payer: Medicaid Other

## 2020-05-18 VITALS — BP 124/71 | Ht 60.0 in | Wt 234.0 lb

## 2020-05-18 DIAGNOSIS — Z3009 Encounter for other general counseling and advice on contraception: Secondary | ICD-10-CM | POA: Diagnosis not present

## 2020-05-18 DIAGNOSIS — Z30013 Encounter for initial prescription of injectable contraceptive: Secondary | ICD-10-CM | POA: Diagnosis not present

## 2020-05-18 LAB — PREGNANCY, URINE: Preg Test, Ur: NEGATIVE

## 2020-05-18 NOTE — Progress Notes (Signed)
Patient in clinic today for Depo. Patient declines taking a home PT. RN sent to lab for PT; PT negative.  Depo given per C. [place] PA order 03/02/2020; left delt.Richmond Campbell, RN

## 2020-08-04 ENCOUNTER — Other Ambulatory Visit: Payer: Self-pay

## 2020-08-04 ENCOUNTER — Ambulatory Visit: Payer: Medicaid Other

## 2020-08-04 ENCOUNTER — Ambulatory Visit (LOCAL_COMMUNITY_HEALTH_CENTER): Payer: Medicaid Other

## 2020-08-04 VITALS — BP 122/79 | Ht 60.0 in | Wt 235.0 lb

## 2020-08-04 DIAGNOSIS — Z30013 Encounter for initial prescription of injectable contraceptive: Secondary | ICD-10-CM

## 2020-08-04 DIAGNOSIS — Z3009 Encounter for other general counseling and advice on contraception: Secondary | ICD-10-CM

## 2020-08-04 NOTE — Progress Notes (Signed)
Patient 11w 1d post last Depo. Pateint given DMPA 150 mg IM today per 03/02/2020 order by Sadie Haber, PA and tolerated well. Hart Carwin, RN

## 2020-10-20 ENCOUNTER — Ambulatory Visit (LOCAL_COMMUNITY_HEALTH_CENTER): Payer: Medicaid Other

## 2020-10-20 ENCOUNTER — Other Ambulatory Visit: Payer: Self-pay

## 2020-10-20 VITALS — BP 123/80 | Ht 60.0 in | Wt 227.0 lb

## 2020-10-20 DIAGNOSIS — Z3009 Encounter for other general counseling and advice on contraception: Secondary | ICD-10-CM

## 2020-10-20 DIAGNOSIS — Z30013 Encounter for initial prescription of injectable contraceptive: Secondary | ICD-10-CM | POA: Diagnosis not present

## 2020-10-20 DIAGNOSIS — Z3042 Encounter for surveillance of injectable contraceptive: Secondary | ICD-10-CM

## 2020-10-20 NOTE — Progress Notes (Signed)
11 weeks post depo. Voices no concerns. Depo given today (Left deltoid) per order by C. [place], [place] dated 03/02/2020. Tolerated well. Next depo due 01/05/2021, pt aware. Jerel Shepherd, RN

## 2021-01-03 ENCOUNTER — Ambulatory Visit: Payer: Medicaid Other

## 2021-01-05 ENCOUNTER — Other Ambulatory Visit: Payer: Self-pay

## 2021-01-05 ENCOUNTER — Ambulatory Visit (LOCAL_COMMUNITY_HEALTH_CENTER): Payer: Medicaid Other

## 2021-01-05 VITALS — BP 118/71 | Ht 60.0 in | Wt 231.0 lb

## 2021-01-05 DIAGNOSIS — Z3009 Encounter for other general counseling and advice on contraception: Secondary | ICD-10-CM

## 2021-01-05 DIAGNOSIS — Z30013 Encounter for initial prescription of injectable contraceptive: Secondary | ICD-10-CM | POA: Diagnosis not present

## 2021-01-05 DIAGNOSIS — Z3042 Encounter for surveillance of injectable contraceptive: Secondary | ICD-10-CM

## 2021-01-05 NOTE — Progress Notes (Signed)
11 weeks post depo. Reports heavy period and cramping when depo is due. Period started 12/30/2020. Consult C. Rolley Sims, [place] who recommends when next period starts when depo is due to take Ibuprofen 800 mg every 8 hrs for 5 days in a row. Advise to take ibuprofen with food. At next depo, due approx 03/23/2021, pt to notify provider if any improvement. RN carried out provider orders. Instructions for ibuprofen written for pt. Pt in agreement.Questions answered and reports understanding. Depo given today per order by C. [place], [place] dated 03/02/2020. Tolerated well R delt. Physical due at next depo, approx, 03/23/2021, pt aware. Jerel Shepherd, RN

## 2021-02-07 ENCOUNTER — Other Ambulatory Visit: Payer: Self-pay

## 2021-02-07 ENCOUNTER — Emergency Department
Admission: EM | Admit: 2021-02-07 | Discharge: 2021-02-07 | Disposition: A | Discharge status: Home / Self Care | Payer: Medicaid Other | Attending: Emergency Medicine | Admitting: Emergency Medicine

## 2021-02-07 DIAGNOSIS — J111 Influenza due to unidentified influenza virus with other respiratory manifestations: Secondary | ICD-10-CM | POA: Diagnosis not present

## 2021-02-07 DIAGNOSIS — R059 Cough, unspecified: Secondary | ICD-10-CM | POA: Diagnosis present

## 2021-02-07 MED ORDER — PREDNISONE 50 MG PO TABS: 50.0000 mg | ORAL_TABLET | Freq: Every day | ORAL | 0 refills | Status: DC

## 2021-02-07 MED ORDER — PSEUDOEPH-BROMPHEN-DM 30-2-10 MG/5ML PO SYRP: 10.0000 mL | ORAL_SOLUTION | Freq: Four times a day (QID) | ORAL | 0 refills | Status: DC | PRN

## 2021-02-07 MED ORDER — PREDNISONE 20 MG PO TABS
60.0000 mg | ORAL_TABLET | Freq: Once | ORAL | Status: AC
  Administered 2021-02-07: 60 mg via ORAL
  Filled 2021-02-07: qty 3

## 2021-02-07 MED ORDER — FLUTICASONE PROPIONATE 50 MCG/ACT NA SUSP: 1.0000 | Freq: Two times a day (BID) | NASAL | 0 refills | Status: DC

## 2021-02-07 NOTE — ED Triage Notes (Signed)
Pt comes with c/o cough, runny nose and congestion. Pt states kids tested + for flu recently.

## 2021-02-07 NOTE — ED Provider Notes (Signed)
[facility] Emergency Department Provider Note  ____________________________________________  Time seen: Approximately 4:39 PM  I have reviewed the triage vital signs and the nursing notes.   HISTORY  Chief Complaint Cough    HPI Madeline Todd is a 30 y.o. female who presents the emergency department complaining of cough, chest wall pain, fevers, chills, nasal congestion, sore throat, body aches.  Her 3 children have tested positive for COVID and she developed symptoms yesterday.  Patient was having pain around her back and into her chest with coughing.  No neck pain or stiffness, no shortness of breath, no abdominal pain, no nausea vomiting, diarrhea or constipation.  She has been taking Tylenol but no other medicines for her symptoms prior to arrival.         Past Medical History:  Diagnosis Date  . Anemia   . Obesity affecting pregnancy in second trimester 2016    There are no problems to display for this patient.   Past Surgical History:  Procedure Laterality Date  . CHOLECYSTECTOMY    . CYSTECTOMY     vaginal cyst    Prior to Admission medications   Medication Sig Start Date End Date Taking? Authorizing Provider  brompheniramine-pseudoephedrine-DM 30-2-10 MG/5ML syrup Take 10 mLs by mouth 4 (four) times daily as needed. 02/07/21  Yes Jeremias Broyhill, Delorise Royals, PA-C  fluticasone (FLONASE) 50 MCG/ACT nasal spray Place 1 spray into both nostrils 2 (two) times daily. 02/07/21  Yes Natalie Leclaire, Delorise Royals, PA-C  predniSONE (DELTASONE) 50 MG tablet Take 1 tablet (50 mg total) by mouth daily with breakfast. 02/07/21  Yes Derrico Zhong, Delorise Royals, PA-C  cyclobenzaprine (FLEXERIL) 10 MG tablet Take 1 tablet (10 mg total) by mouth 3 (three) times daily as needed. Patient not taking: Reported on 03/02/2020 10/01/19   Darci Current, MD  docusate sodium (COLACE) 100 MG capsule Take 1 capsule (100 mg total) by mouth 2 (two) times daily. Patient not taking:  Reported on 05/30/2019 01/08/16   Schermerhorn, Ihor Austin, MD  famotidine (PEPCID) 20 MG tablet Take 1 tablet (20 mg total) by mouth 2 (two) times daily. Patient not taking: Reported on 05/30/2019 01/26/19   Sharman Cheek, MD  HYDROcodone-acetaminophen [facility]) 5-325 MG tablet Take 1 tablet by mouth every 6 (six) hours as needed for up to 15 doses for severe pain. Patient not taking: Reported on 05/30/2019 05/10/18   Merrily Brittle, MD  ibuprofen (ADVIL,MOTRIN) 600 MG tablet Take 1 tablet (600 mg total) by mouth every 8 (eight) hours as needed. Patient not taking: Reported on 05/30/2019 05/10/18   Merrily Brittle, MD  meclizine (ANTIVERT) 25 MG tablet Take 1 tablet (25 mg total) by mouth 3 (three) times daily as needed for dizziness. Patient not taking: Reported on 05/30/2019 09/15/17   Myrna Blazer, MD  Multiple Vitamins-Minerals (MULTIVITAMIN) tablet Take 1 tablet by mouth daily. 03/02/20   Federico Flake, MD  Multiple Vitamins-Minerals (WOMENS MULTI) CAPS Take 1 tablet by mouth 1 day or 1 dose. Patient not taking: Reported on 03/02/2020    [provider]  ondansetron (ZOFRAN ODT) 4 MG disintegrating tablet Take 1 tablet (4 mg total) by mouth every 8 (eight) hours as needed for nausea or vomiting. Patient not taking: Reported on 05/30/2019 01/26/19   Sharman Cheek, MD  ondansetron (ZOFRAN) 4 MG tablet Take 1 tablet (4 mg total) by mouth daily as needed. Patient not taking: Reported on 05/30/2019 09/15/17   Myrna Blazer, MD  Prenatal Vit-Fe Fumarate-FA (PRENATAL MULTIVITAMIN)  TABS tablet Take 1 tablet by mouth at bedtime. Patient not taking: Reported on 03/02/2020    [provider]  ranitidine (ZANTAC) 150 MG tablet Take 150 mg by mouth daily. Reported on 11/04/2015 Patient not taking: Reported on 03/02/2020    [provider]    Allergies Oxycodone and Shrimp [shellfish allergy]  Family History  Problem Relation Age of Onset  . Hypertension  Mother   . Diabetes Mother   . Migraines Mother     Social History Social History   Tobacco Use  . Smoking status: Never Smoker  . Smokeless tobacco: Never Used  Substance Use Topics  . Alcohol use: No  . Drug use: No     Review of Systems  Constitutional: Positive fever/chills.  Positive for body aches Eyes: No visual changes. No discharge ENT: Positive for nasal congestion and sore throat Cardiovascular: Chest/chest wall pain with cough Respiratory: Positive cough. No SOB. Gastrointestinal: No abdominal pain.  No nausea, no vomiting.  No diarrhea.  No constipation. Musculoskeletal: Negative for musculoskeletal pain. Skin: Negative for rash, abrasions, lacerations, ecchymosis. Neurological: Negative for headaches, focal weakness or numbness.  10 System ROS otherwise negative.  ____________________________________________   PHYSICAL EXAM:  VITAL SIGNS: ED Triage Vitals  Enc Vitals Group     BP 02/07/21 1518 122/72     Pulse Rate 02/07/21 1518 100     Resp 02/07/21 1518 17     Temp 02/07/21 1518 98.1 F (36.7 C)     Temp Source 02/07/21 1518 Oral     SpO2 02/07/21 1518 98 %     Weight 02/07/21 1515 229 lb 4.5 oz (104 kg)     Height 02/07/21 1515 5' (1.524 m)     Head Circumference --      Peak Flow --      Pain Score 02/07/21 1407 4     Pain Loc --      Pain Edu? --      Excl. in GC? --      Constitutional: Alert and oriented. Well appearing and in no acute distress. Eyes: Conjunctivae are normal. PERRL. EOMI. Head: Atraumatic. ENT:      Ears: EACs and TMs unremarkable bilaterally.      Nose: Moderate clear congestion/rhinnorhea.      Mouth/Throat: Mucous membranes are moist.  Oropharynx is minimally erythematous but nonedematous.  Uvula is midline. Neck: No stridor.  Neck is supple with full range of motion.  No tenderness. Hematological/Lymphatic/Immunilogical: No cervical lymphadenopathy. Cardiovascular: Normal rate, regular rhythm. Normal S1 and S2.   Good peripheral circulation. Respiratory: Normal respiratory effort without tachypnea or retractions. Lungs CTAB. Good air entry to the bases with no decreased or absent breath sounds. Gastrointestinal: Bowel sounds 4 quadrants. Soft and nontender to palpation. No guarding or rigidity. No palpable masses. No distention. No CVA tenderness. Musculoskeletal: Full range of motion to all extremities. No gross deformities appreciated.  Palpation along the chest wall in the intercostal margins reveals tenderness bilaterally, worse in the posterior ribs and slightly less along the lateral and anterior rib cage.  No palpable abnormality or crepitus.  No subcutaneous emphysema.  Good underlying breath sounds bilaterally. Neurologic:  Normal speech and language. No gross focal neurologic deficits are appreciated.  Skin:  Skin is warm, dry and intact. No rash noted. Psychiatric: Mood and affect are normal. Speech and behavior are normal. Patient exhibits appropriate insight and judgement.   ____________________________________________   LABS (all labs ordered are listed, but only abnormal results  are displayed)  Labs Reviewed - No data to display ____________________________________________  EKG   ____________________________________________  RADIOLOGY   No results found.  ____________________________________________    PROCEDURES  Procedure(s) performed:    Procedures    Medications  predniSONE (DELTASONE) tablet 60 mg (60 mg Oral Given 02/07/21 1642)     ____________________________________________   INITIAL IMPRESSION / ASSESSMENT AND PLAN / ED COURSE  Pertinent labs & imaging results that were available during my care of the patient were reviewed by me and considered in my medical decision making (see chart for details).  Review of the [place] CSRS was performed in accordance of the [facility] prior to dispensing any controlled drugs.           Patient's diagnosis is  consistent with influenza.  Patient presented to the emergency department with multiple symptoms consistent with flu with positive contacts from her kids.  Patient was having some chest/chest wall pain on arrival.  This is reproducible with palpation of the intercostal margins.  Given the repetitive coughing I feel that this is likely costochondritis.  Pain does not specifically pleuritic in nature, she was not tachycardic with no history of DVT or PE.  I think PE is much less likely as this pain is clearly reproducible with palpation.  Patient will have a course of prednisone, Bromfed cough syrup, Flonase for symptom control.  Tylenol and Motrin at home.  Drink plenty of fluids and rest.  Return precautions discussed with the patient.  Otherwise follow-up primary care. Patient is given ED precautions to return to the ED for any worsening or new symptoms.     ____________________________________________  FINAL CLINICAL IMPRESSION(S) / ED DIAGNOSES  Final diagnoses:  Influenza      NEW MEDICATIONS STARTED DURING THIS VISIT:  ED Discharge Orders         Ordered    predniSONE (DELTASONE) 50 MG tablet  Daily with breakfast        02/07/21 1640    brompheniramine-pseudoephedrine-DM 30-2-10 MG/5ML syrup  4 times daily PRN        02/07/21 1640    fluticasone (FLONASE) 50 MCG/ACT nasal spray  2 times daily        02/07/21 1640              This chart was dictated using voice recognition software/Dragon. Despite best efforts to proofread, errors can occur which can change the meaning. Any change was purely unintentional.    Racheal Patches, PA-C 02/07/21 1644    Chesley Noon, MD 02/10/21 [phone]

## 2021-02-07 NOTE — ED Notes (Signed)
See triage note  Presents with fever and body aches since yesterday  Also having some discomfort across with inspiration

## 2021-03-24 ENCOUNTER — Ambulatory Visit: Payer: Medicaid Other

## 2021-03-24 ENCOUNTER — Ambulatory Visit (LOCAL_COMMUNITY_HEALTH_CENTER): Payer: Medicaid Other | Admitting: Physician Assistant

## 2021-03-24 ENCOUNTER — Other Ambulatory Visit: Payer: Self-pay

## 2021-03-24 ENCOUNTER — Encounter: Payer: Self-pay | Admitting: Physician Assistant

## 2021-03-24 VITALS — BP 113/67 | HR 69 | Ht 60.0 in | Wt 234.6 lb

## 2021-03-24 DIAGNOSIS — Z3009 Encounter for other general counseling and advice on contraception: Secondary | ICD-10-CM | POA: Diagnosis not present

## 2021-03-24 DIAGNOSIS — Z01419 Encounter for gynecological examination (general) (routine) without abnormal findings: Secondary | ICD-10-CM | POA: Diagnosis not present

## 2021-03-24 DIAGNOSIS — Z309 Encounter for contraceptive management, unspecified: Secondary | ICD-10-CM | POA: Diagnosis not present

## 2021-03-24 DIAGNOSIS — Z6841 Body Mass Index (BMI) 40.0 and over, adult: Secondary | ICD-10-CM | POA: Insufficient documentation

## 2021-03-24 LAB — WET PREP FOR TRICH, YEAST, CLUE
Trichomonas Exam: NEGATIVE
Yeast Exam: NEGATIVE

## 2021-03-24 NOTE — Progress Notes (Signed)
Family Planning Visit- Repeat Yearly Visit  Subjective:  Madeline Todd is a 30 y.o. [phone]  being seen today for an annual wellness visit and to discuss contraception options.   The patient is currently using Depo Provera for pregnancy prevention. Patient does not want a pregnancy in the next year. Patient has the following medical problems: has BMI 45.0-49.9, adult [facility]) on their problem list.  Chief Complaint  Patient presents with   Annual Exam    Patient reports she feels well today. Has had some recent vaginal odor. Has been trying to lose weight via diet and exercise.  Patient denies vaginal itch, discharge or bleeding. Usually has some bleeding toward the end of her Depo cycle.   See flowsheet for other program required questions.   Body mass index is 45.82 kg/m. - Patient is eligible for diabetes screening based on BMI and age >21?  no HA1C ordered? not applicable  Patient reports 1 of partners in last year. Desires STI screening?  Yes   Has patient been screened once for HCV in the past?  No  No results found for: HCVAB  Does the patient have current of drug use, have a partner with drug use, and/or has been incarcerated since last result? Yes  If yes-- Screen for HCV through [facility]   Does the patient meet criteria for HBV testing? No  Criteria:  -Household, sexual or needle sharing contact with HBV -History of drug use -HIV positive -Those with known Hep C   Health Maintenance Due  Topic Date Due   COVID-19 Vaccine (1) Never done   Hepatitis C Screening  Never done   TETANUS/TDAP  Never done    Review of Systems  All other systems reviewed and are negative.  The following portions of the patient's history were reviewed and updated as appropriate: allergies, current medications, past family history, past medical history, past social history, past surgical history and problem list. Problem list updated.  Objective:   Vitals:   03/24/21  0942  BP: 113/67  Pulse: 69  Weight: 234 lb 9.6 oz (106.4 kg)  Height: 5' (1.524 m)    Physical Exam Constitutional:      Appearance: Normal appearance.  HENT:     Head: Normocephalic and atraumatic.  Pulmonary:     Effort: Pulmonary effort is normal.  Chest:  Breasts:    Tanner Score is 5.     Right: Normal. No mass, nipple discharge, axillary adenopathy or supraclavicular adenopathy.     Left: Normal. No mass, nipple discharge, axillary adenopathy or supraclavicular adenopathy.  Abdominal:     Palpations: Abdomen is soft.  Genitourinary:    General: Normal vulva.     Exam position: Lithotomy position.     Pubic Area: No rash or pubic lice.      Tanner stage (genital): 5.     Labia:        Right: No tenderness or lesion.        Left: No tenderness or lesion.      Vagina: Bleeding present. No tenderness or lesions.     Cervix: No cervical motion tenderness.     Uterus: Not tender.      Adnexa:        Right: No tenderness.         Left: No tenderness.       Rectum: No external hemorrhoid.     Comments: Scant blood in vag vault Musculoskeletal:  General: Normal range of motion.  Lymphadenopathy:     Upper Body:     Right upper body: No supraclavicular, axillary or pectoral adenopathy.     Left upper body: No supraclavicular, axillary or pectoral adenopathy.     Lower Body: No right inguinal adenopathy. No left inguinal adenopathy.  Skin:    General: Skin is warm and dry.  Neurological:     General: No focal deficit present.     Mental Status: She is alert.  Psychiatric:        Mood and Affect: Mood normal.        Behavior: Behavior normal.      Assessment and Plan:  Madeline Todd is a 30 y.o. female [phone] presenting to the [facility] Department for an yearly wellness and contraception visit  Contraception counseling: Reviewed all forms of birth control options in the tiered based approach. available including abstinence; over the  counter/barrier methods; hormonal contraceptive medication including pill, patch, ring, injection,contraceptive implant, ECP; hormonal and nonhormonal IUDs; permanent sterilization options including vasectomy and the various tubal sterilization modalities. Risks, benefits, and typical effectiveness rates were reviewed.  Questions were answered.  Written information was also given to the patient to review.  Patient desires to switch to Nexplanon, this was scheduled for patient next week. She will follow up in  12 mo for surveillance.  She was told to call with any further questions, or with any concerns about this method of contraception.  Emphasized use of condoms 100% of the time for STI prevention.  Patient was not offered ECP.    1. Family planning services Continue DMPA (today is [redacted]w[redacted]d since last injection) until f/u appt 7/18 for Nexplanon insertion. Enc condoms to prevent STIs.  2. Well woman exam with routine gynecological exam Routine Pap due next year. Enc f/u with PCP for any acute and chronic issues. Continue daily MVI with folic acid. - HIV [facility] - Syphilis Serology, [facility] - Chlamydia/Gonorrhea [facility] - WET PREP FOR TRICH, YEAST, CLUE  3. BMI 45.0-49.9, adult (HCC) Enc weight loss via diet/exercise and f/u with PCP.   Return in about 1 year (around 03/24/2022) for Annual well-woman exam.  Future Appointments  Date Time Provider Department Center  03/28/2021 10:40 AM [facility] PROVIDER [facility] None    Landry Dyke, PA-C

## 2021-03-28 ENCOUNTER — Other Ambulatory Visit: Payer: Self-pay

## 2021-03-28 ENCOUNTER — Ambulatory Visit: Payer: Medicaid Other

## 2021-03-29 LAB — HM HIV SCREENING LAB: HM HIV Screening: NEGATIVE

## 2021-03-30 ENCOUNTER — Encounter: Payer: Self-pay | Admitting: Family Medicine

## 2021-03-30 ENCOUNTER — Ambulatory Visit (LOCAL_COMMUNITY_HEALTH_CENTER): Payer: Medicaid Other | Admitting: Family Medicine

## 2021-03-30 ENCOUNTER — Other Ambulatory Visit: Payer: Self-pay

## 2021-03-30 VITALS — BP 113/74 | HR 82 | Temp 98.2°F | Resp 18 | Ht 61.0 in | Wt 231.0 lb

## 2021-03-30 DIAGNOSIS — Z3009 Encounter for other general counseling and advice on contraception: Secondary | ICD-10-CM | POA: Diagnosis not present

## 2021-03-30 DIAGNOSIS — Z30017 Encounter for initial prescription of implantable subdermal contraceptive: Secondary | ICD-10-CM

## 2021-03-30 MED ORDER — ETONOGESTREL 68 MG ~~LOC~~ IMPL
68.0000 mg | DRUG_IMPLANT | Freq: Once | SUBCUTANEOUS | Status: AC
  Administered 2021-03-30: 68 mg via SUBCUTANEOUS

## 2021-03-30 NOTE — Progress Notes (Signed)
Patient here for Nexplanon insertion.   Post insertions instructions given and questions answered.   Nexplanon card mailed to patient.   Nexplanon Instructions After Insertion  Keep bandage clean and dry for 24 hours  May use ice/Tylenol/Ibuprofen for soreness or pain  If you develop fever, drainage or increased warmth from incision site-contact office immediately    Floy Sabina, RN

## 2021-03-30 NOTE — Progress Notes (Signed)
   S: Patient her for nexplanon insertion.  Denies need for STI testing.   O: PE  done one 03/24/21, last pap was 05/30/19.   A/P: 1. Nexplanon insertion  Nexplanon Insertion Procedure Patient identified, informed consent performed, consent signed.   Patient does understand that irregular bleeding is a very common side effect of this medication. She was advised to have backup contraception after placement. Patient was determined to meet WHO criteria for not being pregnant. Appropriate time out taken.  The insertion site was identified 8-10 cm (3-4 inches) from the medial epicondyle of the humerus and 3-5 cm (1.25-2 inches) posterior to (below) the sulcus (groove) between the biceps and triceps muscles of the patient's Left arm and marked.  Patient was prepped with alcohol swab and then injected with 3 ml of 1% lidocaine.  Arm was prepped with chlorhexidene, Nexplanon removed from packaging,  Device confirmed in needle, then inserted full length of needle and withdrawn per handbook instructions. Nexplanon was able to palpated in the patient's arm; patient palpated the insert herself. There was minimal blood loss.  Patient insertion site covered with guaze and a pressure bandage to reduce any bruising.  The patient tolerated the procedure well and was given post procedure instructions.    Counseled patient to take OTC analgesic starting as soon as lidocaine starts to wear off and take regularly for at least 48 hr to decrease discomfort.  Specifically to take with food or milk to decrease stomach upset and for IB 600 mg (3 tablets) every 6 hrs; IB 800 mg (4 tablets) every 8 hrs; or Aleve 2 tablets every 12 hrs.    Wendi Snipes, FNP

## 2021-04-03 ENCOUNTER — Encounter: Payer: Self-pay | Admitting: Physician Assistant

## 2021-06-07 ENCOUNTER — Encounter: Payer: Self-pay | Admitting: Emergency Medicine

## 2021-06-07 ENCOUNTER — Other Ambulatory Visit: Payer: Self-pay

## 2021-06-07 ENCOUNTER — Emergency Department
Admission: EM | Admit: 2021-06-07 | Discharge: 2021-06-07 | Disposition: A | Discharge status: Home / Self Care | Payer: Medicaid Other | Attending: Emergency Medicine | Admitting: Emergency Medicine

## 2021-06-07 DIAGNOSIS — S161XXA Strain of muscle, fascia and tendon at neck level, initial encounter: Secondary | ICD-10-CM | POA: Insufficient documentation

## 2021-06-07 DIAGNOSIS — S39012A Strain of muscle, fascia and tendon of lower back, initial encounter: Secondary | ICD-10-CM | POA: Insufficient documentation

## 2021-06-07 DIAGNOSIS — S3992XA Unspecified injury of lower back, initial encounter: Secondary | ICD-10-CM | POA: Diagnosis present

## 2021-06-07 DIAGNOSIS — Z87891 Personal history of nicotine dependence: Secondary | ICD-10-CM | POA: Insufficient documentation

## 2021-06-07 DIAGNOSIS — Y92410 Unspecified street and highway as the place of occurrence of the external cause: Secondary | ICD-10-CM | POA: Insufficient documentation

## 2021-06-07 MED ORDER — NAPROXEN 500 MG PO TABS: 500.0000 mg | ORAL_TABLET | Freq: Two times a day (BID) | ORAL | 2 refills | Status: DC
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
## 2022-01-27 ENCOUNTER — Ambulatory Visit (LOCAL_COMMUNITY_HEALTH_CENTER): Payer: Medicaid Other | Admitting: Family Medicine

## 2022-01-27 VITALS — BP 117/74 | Ht 61.0 in | Wt 229.4 lb

## 2022-01-27 DIAGNOSIS — Z3046 Encounter for surveillance of implantable subdermal contraceptive: Secondary | ICD-10-CM

## 2022-01-27 DIAGNOSIS — Z3009 Encounter for other general counseling and advice on contraception: Secondary | ICD-10-CM | POA: Diagnosis not present

## 2022-01-27 DIAGNOSIS — Z3169 Encounter for other general counseling and advice on procreation: Secondary | ICD-10-CM

## 2022-01-27 MED ORDER — PRENATAL VITAMIN 27-0.8 MG PO TABS: 1.0000 | ORAL_TABLET | ORAL | 0 refills | Status: AC

## 2022-01-27 NOTE — Progress Notes (Signed)
Pt here for Nexplanon removal.  PNV given.  Windle Guard, RN

## 2022-01-27 NOTE — Addendum Note (Signed)
Addended by: Windle Guard on: 01/27/2022 05:04 PM   Modules accepted: Orders

## 2022-01-27 NOTE — Progress Notes (Signed)
[facility] Department [address]  [place]  CLINIC PROCEDURE NOTE Madeline Todd is a 31 y.o. [phone] here for Nexplanon removal   Nexplanon Removal Patient identified, informed consent performed, consent signed.   Appropriate time out taken. Nexplanon site identified.  Area prepped in usual sterile fashon. One ml of 1% lidocaine was used to anesthetize the area at the distal end of the implant. A small stab incision was made right beside the implant on the distal portion.  The Nexplanon rod was grasped using hemostats and removed without difficulty.  There was minimal blood loss. There were no complications.  3 ml of 1% lidocaine was injected around the incision for post-procedure analgesia.  Steri-strips were applied over the small incision.  A pressure bandage was applied to reduce any bruising.  The patient tolerated the procedure well and was given post procedure instructions.  Patient is planning to use nothing for contraception/attempt conception.  Patient is planning on conceiving. We reviewed her current problems and medications in terms of pregnancy safety. Additionally discussed fertility awareness, when to take a pregnancy test, and starting a prenatal vitamin. We discussed general early pregnancy precautions. Reviewed services at the office regarding prenatal care. She voiced understanding.  Problem specific recommendations are: Weight reduction and health eating RN to dispense prenatal vitamins to client.  Reviewed return of fertility and taking monthly pregnancy test.  Federico Flake, MD, MPH, ABFM Medical Director  [facility] Department

## 2022-05-22 ENCOUNTER — Emergency Department: Payer: No Typology Code available for payment source

## 2022-05-22 ENCOUNTER — Other Ambulatory Visit: Payer: Self-pay

## 2022-05-22 ENCOUNTER — Emergency Department
Admission: EM | Admit: 2022-05-22 | Discharge: 2022-05-22 | Disposition: A | Discharge status: Home / Self Care | Payer: No Typology Code available for payment source | Attending: Emergency Medicine | Admitting: Emergency Medicine

## 2022-05-22 DIAGNOSIS — M546 Pain in thoracic spine: Secondary | ICD-10-CM | POA: Diagnosis not present

## 2022-05-22 DIAGNOSIS — Y92410 Unspecified street and highway as the place of occurrence of the external cause: Secondary | ICD-10-CM | POA: Diagnosis not present

## 2022-05-22 DIAGNOSIS — M542 Cervicalgia: Secondary | ICD-10-CM | POA: Insufficient documentation

## 2022-05-22 DIAGNOSIS — R519 Headache, unspecified: Secondary | ICD-10-CM | POA: Diagnosis not present

## 2022-05-22 MED ORDER — IBUPROFEN 800 MG PO TABS
800.0000 mg | ORAL_TABLET | Freq: Once | ORAL | Status: AC
  Administered 2022-05-22: 800 mg via ORAL
  Filled 2022-05-22: qty 1

## 2022-05-22 NOTE — ED Notes (Signed)
Patient transported to CT 
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
## 2022-05-22 NOTE — Discharge Instructions (Signed)
You may alternate Tylenol 1000 mg every 6 hours as needed for pain, fever and Ibuprofen 800 mg every 6-8 hours as needed for pain, fever.  Please take Ibuprofen with food.  Do not take more than 4000 mg of Tylenol (acetaminophen) in a 24 hour period. ° °

## 2022-10-20 NOTE — Progress Notes (Signed)
10-20-2022 Received a request for medical records from [employer] for the purpose of HEDIS quality assessment for patient Madeline Todd, 1991. Dates of service for records requested 09/11/2017 to 09/10/2022. Specific records requested: [facility] records. Per request, [facility] medical records for 06/04/2019 pap smear results released and mailed certified mail to [employer], Attn: HEDIS, [address], [place] [address]. Inetta Fermo RN.

## 2022-11-24 ENCOUNTER — Emergency Department
Admission: EM | Admit: 2022-11-24 | Discharge: 2022-11-24 | Disposition: A | Discharge status: Home / Self Care | Payer: Medicaid Other | Attending: Emergency Medicine | Admitting: Emergency Medicine

## 2022-11-24 ENCOUNTER — Other Ambulatory Visit: Payer: Self-pay

## 2022-11-24 ENCOUNTER — Encounter: Payer: Self-pay | Admitting: Emergency Medicine

## 2022-11-24 DIAGNOSIS — R1012 Left upper quadrant pain: Secondary | ICD-10-CM | POA: Diagnosis present

## 2022-11-24 DIAGNOSIS — K2900 Acute gastritis without bleeding: Secondary | ICD-10-CM | POA: Insufficient documentation

## 2022-11-24 LAB — URINALYSIS, ROUTINE W REFLEX MICROSCOPIC
Bilirubin Urine: NEGATIVE
Glucose, UA: NEGATIVE mg/dL
Hgb urine dipstick: NEGATIVE
Ketones, ur: 5 mg/dL — AB
Nitrite: NEGATIVE
Protein, ur: NEGATIVE mg/dL
Specific Gravity, Urine: 1.008 (ref 1.005–1.030)
pH: 8 (ref 5.0–8.0)

## 2022-11-24 LAB — CBC
HCT: 36.7 % (ref 36.0–46.0)
Hemoglobin: 12.5 g/dL (ref 12.0–15.0)
MCH: 29.6 pg (ref 26.0–34.0)
MCHC: 34.1 g/dL (ref 30.0–36.0)
MCV: 87 fL (ref 80.0–100.0)
Platelets: 378 10*3/uL (ref 150–400)
RBC: 4.22 MIL/uL (ref 3.87–5.11)
RDW: 12.9 % (ref 11.5–15.5)
WBC: 7.1 10*3/uL (ref 4.0–10.5)
nRBC: 0 % (ref 0.0–0.2)

## 2022-11-24 LAB — COMPREHENSIVE METABOLIC PANEL
ALT: 25 U/L (ref 0–44)
AST: 24 U/L (ref 15–41)
Albumin: 4 g/dL (ref 3.5–5.0)
Alkaline Phosphatase: 84 U/L (ref 38–126)
Anion gap: 11 (ref 5–15)
BUN: 10 mg/dL (ref 6–20)
CO2: 24 mmol/L (ref 22–32)
Calcium: 8.9 mg/dL (ref 8.9–10.3)
Chloride: 103 mmol/L (ref 98–111)
Creatinine, Ser: 0.55 mg/dL (ref 0.44–1.00)
GFR, Estimated: >60 mL/min (ref >60)
Glucose, Bld: 110 mg/dL — ABNORMAL HIGH (ref 70–99)
Potassium: 3.4 mmol/L — ABNORMAL LOW (ref 3.5–5.1)
Sodium: 138 mmol/L (ref 135–145)
Total Bilirubin: 1 mg/dL (ref 0.3–1.2)
Total Protein: 7.4 g/dL (ref 6.5–8.1)

## 2022-11-24 LAB — POC URINE PREG, ED: Preg Test, Ur: NEGATIVE

## 2022-11-24 LAB — LIPASE, BLOOD: Lipase: 27 U/L (ref 11–51)

## 2022-11-24 MED ORDER — KETOROLAC TROMETHAMINE 15 MG/ML IJ SOLN
15.0000 mg | Freq: Once | INTRAMUSCULAR | Status: AC
  Administered 2022-11-24: 15 mg via INTRAVENOUS
  Filled 2022-11-24: qty 1

## 2022-11-24 MED ORDER — PANTOPRAZOLE SODIUM 40 MG IV SOLR
40.0000 mg | Freq: Once | INTRAVENOUS | Status: AC
  Administered 2022-11-24: 40 mg via INTRAVENOUS
  Filled 2022-11-24: qty 10

## 2022-11-24 MED ORDER — SODIUM CHLORIDE 0.9 % IV BOLUS
1000.0000 mL | Freq: Once | INTRAVENOUS | Status: AC
  Administered 2022-11-24: 1000 mL via INTRAVENOUS

## 2022-11-24 MED ORDER — ONDANSETRON HCL 4 MG/2ML IJ SOLN
4.0000 mg | Freq: Once | INTRAMUSCULAR | Status: AC
  Administered 2022-11-24: 4 mg via INTRAVENOUS
  Filled 2022-11-24: qty 2

## 2022-11-24 MED ORDER — METOCLOPRAMIDE HCL 10 MG PO TABS: 10.0000 mg | ORAL_TABLET | Freq: Four times a day (QID) | ORAL | 0 refills | Status: DC | PRN

## 2022-11-24 MED ORDER — FAMOTIDINE 20 MG PO TABS: 20.0000 mg | ORAL_TABLET | Freq: Two times a day (BID) | ORAL | 0 refills | Status: DC

## 2022-11-24 MED ORDER — ALUMINUM-MAGNESIUM-SIMETHICONE 200-200-20 MG/5ML PO SUSP: 30.0000 mL | Freq: Three times a day (TID) | ORAL | 0 refills | Status: DC

## 2022-11-24 NOTE — ED Provider Notes (Signed)
[facility] Provider Note    Event Date/Time   First MD Initiated Contact with Patient 11/24/22 [phone]     (approximate)   History   Chief Complaint: Abdominal Pain   HPI  Madeline Todd is a 32 y.o. female with past history of cholecystectomy who comes ED complaining of left upper quadrant abdominal pain for the last 2 days associated with nausea, loose bowel movements.  Denies vomiting.  No black or bloody stool.  No fevers chills chest pain shortness of breath.  Worse at night, better during the daytime.  Eating okay.  Gradual onset, waxing and waning and nonradiating     Physical Exam   Triage Vital Signs: ED Triage Vitals  Enc Vitals Group     BP 11/24/22 0744 128/63     Pulse Rate 11/24/22 0744 74     Resp 11/24/22 0744 18     Temp 11/24/22 0744 98.2 F (36.8 C)     Temp Source 11/24/22 0744 Oral     SpO2 11/24/22 0744 100 %     Weight --      Height --      Head Circumference --      Peak Flow --      Pain Score 11/24/22 0742 7     Pain Loc --      Pain Edu? --      Excl. in [place]? --     Most recent vital signs: Vitals:   11/24/22 1049 11/24/22 1100  BP: 115/63 122/70  Pulse: 66 65  Resp: 18 17  Temp:    SpO2: 100% 100%    General: Awake, no distress.  CV:  Good peripheral perfusion.  Regular rate rhythm Resp:  Normal effort.  Clear to auscultation bilaterally Abd:  No distention.  Soft with left upper quadrant tenderness  Other:  Moist oral mucosa, no lower extremity edema.  No rash   ED Results / Procedures / Treatments   Labs (all labs ordered are listed, but only abnormal results are displayed) Labs Reviewed  COMPREHENSIVE METABOLIC PANEL - Abnormal; Notable for the following components:      Result Value   Potassium 3.4 (*)    Glucose, Bld 110 (*)    All other components within normal limits  URINALYSIS, ROUTINE W REFLEX MICROSCOPIC - Abnormal; Notable for the following components:   Color, Urine STRAW (*)     APPearance CLEAR (*)    Ketones, ur 5 (*)    Leukocytes,Ua TRACE (*)    Bacteria, UA RARE (*)    All other components within normal limits  LIPASE, BLOOD  CBC  POC URINE PREG, ED     EKG Interpreted by me Normal sinus rhythm rate of 73.  Normal axis intervals QRS ST segments and T waves   RADIOLOGY    PROCEDURES:  Procedures   MEDICATIONS ORDERED IN ED: Medications  ketorolac (TORADOL) 15 MG/ML injection 15 mg (15 mg Intravenous Given 11/24/22 0901)  pantoprazole (PROTONIX) injection 40 mg (40 mg Intravenous Given 11/24/22 0900)  ondansetron (ZOFRAN) injection 4 mg (4 mg Intravenous Given 11/24/22 0900)  sodium chloride 0.9 % bolus 1,000 mL (0 mLs Intravenous Stopped 11/24/22 1015)     IMPRESSION / MDM / ASSESSMENT AND PLAN / ED COURSE  I reviewed the triage vital signs and the nursing notes.  DDx: Gastritis, pancreatitis, constipation, intestinal gas  Patient's presentation is most consistent with acute presentation with potential threat to life or bodily function.  Patient presents with upper abdominal pain, has left upper quadrant tenderness.  Overall reassuring exam, normal vitals.  Labs are all normal.  Patient given antacids and feels much better.  Tolerating oral intake.  Will continue on Pepcid and Reglan.  Doubt ACS PE dissection AAA pregnancy pancreatitis bowel obstruction perforation.       FINAL CLINICAL IMPRESSION(S) / ED DIAGNOSES   Final diagnoses:  Acute gastritis without hemorrhage, unspecified gastritis type     Rx / DC Orders   ED Discharge Orders          Ordered    aluminum-magnesium hydroxide-simethicone (MAALOX) [id number] MG/5ML SUSP  3 times daily before meals & bedtime        11/24/22 1147    famotidine (PEPCID) 20 MG tablet  2 times daily        11/24/22 1147    metoCLOPramide (REGLAN) 10 MG tablet  Every 6 hours PRN        11/24/22 1147             Note:  This document was prepared using Dragon voice recognition  software and may include unintentional dictation errors.   Carrie Mew, MD 11/24/22 1150

## 2022-11-24 NOTE — ED Triage Notes (Signed)
Patient to ED for left upper abd pain x2 days with nausea and diarrhea- no vomiting. Hx of gallbladder removal.

## 2023-02-14 ENCOUNTER — Ambulatory Visit (LOCAL_COMMUNITY_HEALTH_CENTER): Payer: Medicaid Other | Admitting: Family Medicine

## 2023-02-14 ENCOUNTER — Encounter: Payer: Self-pay | Admitting: Family Medicine

## 2023-02-14 VITALS — BP 121/75 | HR 80 | Ht 61.0 in | Wt 227.0 lb

## 2023-02-14 DIAGNOSIS — Z309 Encounter for contraceptive management, unspecified: Secondary | ICD-10-CM | POA: Diagnosis not present

## 2023-02-14 DIAGNOSIS — Z30017 Encounter for initial prescription of implantable subdermal contraceptive: Secondary | ICD-10-CM | POA: Diagnosis not present

## 2023-02-14 DIAGNOSIS — Z01419 Encounter for gynecological examination (general) (routine) without abnormal findings: Secondary | ICD-10-CM

## 2023-02-14 DIAGNOSIS — Z1331 Encounter for screening for depression: Secondary | ICD-10-CM

## 2023-02-14 DIAGNOSIS — Z113 Encounter for screening for infections with a predominantly sexual mode of transmission: Secondary | ICD-10-CM

## 2023-02-14 DIAGNOSIS — B9689 Other specified bacterial agents as the cause of diseases classified elsewhere: Secondary | ICD-10-CM

## 2023-02-14 LAB — HM HIV SCREENING LAB: HM HIV Screening: NEGATIVE

## 2023-02-14 LAB — WET PREP FOR TRICH, YEAST, CLUE
Trichomonas Exam: NEGATIVE
Yeast Exam: NEGATIVE

## 2023-02-14 MED ORDER — METRONIDAZOLE 500 MG PO TABS: 500.0000 mg | ORAL_TABLET | Freq: Two times a day (BID) | ORAL | 0 refills | Status: AC

## 2023-02-14 MED ORDER — ETONOGESTREL 68 MG ~~LOC~~ IMPL
68.0000 mg | DRUG_IMPLANT | Freq: Once | SUBCUTANEOUS | Status: AC
  Administered 2023-02-14: 68 mg via SUBCUTANEOUS

## 2023-02-14 NOTE — Progress Notes (Signed)
[facility] DEPARTMENT [facility] [address] Main Number: [phone]   Family Planning Visit- Initial Visit  Subjective:  Madeline Todd is a 32 y.o.  [id number]   being seen today for an initial annual visit and to discuss reproductive life planning.  The patient is currently using Hormonal Injection for pregnancy prevention. Patient reports   does not want a pregnancy in the next year.     report they are looking for a method that provides High efficacy at preventing pregnancy  Patient has the following medical conditions has BMI 45.0-49.9, adult [facility]) on their problem list.  Chief Complaint  Patient presents with   Annual Exam    Wants nexplanon    Patient reports to clinic for PE and nexplanon. Due for pap smear.   Body mass index is 42.89 kg/m. - Patient is eligible for diabetes screening based on BMI> 25 and age >35?  no HA1C ordered? not applicable  Patient reports 1  partner/s in last year. Desires STI screening?  Yes  Has patient been screened once for HCV in the past?  No  No results found for: "HCVAB"  Does the patient have current drug use (including MJ), have a partner with drug use, and/or has been incarcerated since last result? No  If yes-- Screen for HCV through [facility]   Does the patient meet criteria for HBV testing? No  Criteria:  -Household, sexual or needle sharing contact with HBV -History of drug use -HIV positive -Those with known Hep C   Health Maintenance Due  Topic Date Due   COVID-19 Vaccine (1) Never done   Hepatitis C Screening  Never done   PAP SMEAR-Modifier  05/29/2022    Review of Systems  Constitutional:  Negative for weight loss.  Eyes:  Negative for blurred vision.  Respiratory:  Negative for cough and shortness of breath.   Cardiovascular:  Negative for claudication.  Gastrointestinal:  Positive for nausea.  Genitourinary:  Negative for dysuria and frequency.  Skin:   Negative for rash.  Neurological:  Negative for headaches.  Endo/Heme/Allergies:  Does not bruise/bleed easily.  Psychiatric/Behavioral:  Positive for depression.     The following portions of the patient's history were reviewed and updated as appropriate: allergies, current medications, past family history, past medical history, past social history, past surgical history and problem list. Problem list updated.   See flowsheet for other program required questions.  Objective:   Vitals:   02/14/23 1526  BP: 121/75  Pulse: 80  Weight: 227 lb (103 kg)  Height: 5\' 1"  (1.549 m)    Physical Exam Vitals and nursing note reviewed.  Constitutional:      Appearance: Normal appearance.  HENT:     Head: Normocephalic and atraumatic.     Mouth/Throat:     Mouth: Mucous membranes are moist.     Pharynx: Oropharynx is clear. No oropharyngeal exudate or posterior oropharyngeal erythema.  Pulmonary:     Effort: Pulmonary effort is normal.  Abdominal:     General: Abdomen is flat.     Palpations: There is no mass.     Tenderness: There is no abdominal tenderness. There is no rebound.  Genitourinary:    General: Normal vulva.     Exam position: Lithotomy position.     Pubic Area: No rash or pubic lice.      Labia:        Right: No rash or lesion.  Left: No rash or lesion.      Vagina: Vaginal discharge present. No erythema, bleeding or lesions.     Cervix: No cervical motion tenderness, discharge, friability, lesion or erythema.     Uterus: Normal.      Adnexa: Right adnexa normal and left adnexa normal.     Rectum: Normal.     Comments: pH = 4  Malodorous discharge and menses present in vaginal canal Lymphadenopathy:     Head:     Right side of head: No preauricular or posterior auricular adenopathy.     Left side of head: No preauricular or posterior auricular adenopathy.     Cervical: No cervical adenopathy.     Upper Body:     Right upper body: No supraclavicular,  axillary or epitrochlear adenopathy.     Left upper body: No supraclavicular, axillary or epitrochlear adenopathy.     Lower Body: No right inguinal adenopathy. No left inguinal adenopathy.  Skin:    General: Skin is warm and dry.     Findings: No rash.  Neurological:     Mental Status: She is alert and oriented to person, place, and time.       Assessment and Plan:  Madeline Todd is a 32 y.o. female presenting to the [facility] Department for an initial annual wellness/contraceptive visit  1. Well woman exam with routine gynecological exam -only concerned about mood- see below -otherwise reports she has had heavier periods which is why she wants to return to using Nexplanon -denies breast issues- CBE deferred today  - IGP, Aptima HPV - WET PREP FOR TRICH, YEAST, CLUE  2. Screening for venereal disease -treated for BV today per standing order   - WET PREP FOR TRICH, YEAST, CLUE - HIV [facility] - Chlamydia/Gonorrhea [facility] - Syphilis Serology, [facility]  3. 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 Encounter for initial prescription of Nexplanon Contraception counseling: Reviewed options based on patient desire and reproductive life plan. Patient is interested in Hormonal Implant. This was provided to the patient today.   Risks, benefits, and typical effectiveness rates were reviewed.  Questions were answered.  Written information was also given to the patient to review.    The patient will follow up in  1 years for surveillance.  The patient was told to call with any further questions, or with any concerns about this method of contraception.  Emphasized use of condoms  100% of the time for STI prevention.  Educated on ECP and assessed for need of ECP. Not indicated- currently on menses Nexplanon Insertion Procedure Patient identified, informed consent performed, consent signed.   Patient does understand that irregular bleeding is a very common side effect of this medication. She was advised to have backup contraception after placement. Patient was determined to meet WHO criteria for not being pregnant. Appropriate time out taken.  The insertion site was identified 8-10 cm (3-4 inches) from the medial epicondyle of the humerus and 3-5 cm (1.25-2 inches) posterior to (below) the sulcus (groove) between the biceps and triceps muscles of the patient's left arm and marked.  Patient was prepped with alcohol swab and then injected with 3 ml of 1% lidocaine.  Arm was prepped with chlorhexidene, Nexplanon removed from packaging,  Device confirmed in needle, then inserted full length of needle and withdrawn per handbook instructions. Nexplanon was able to palpated in the patient's arm; patient palpated the insert herself. There was minimal blood loss.  Patient insertion site covered with guaze and a pressure bandage to reduce any bruising.  The patient tolerated the procedure well and was given post procedure instructions.   Nexplanon:   Counseled patient to take OTC analgesic starting as soon as lidocaine starts to wear off and take regularly for at least 48 hr to decrease discomfort.  Specifically to take with food or milk to decrease stomach upset and for IB 600 mg (3 tablets) every 6 hrs; IB 800 mg (4 tablets) every 8 hrs; or Aleve 2 tablets every 12 hrs.   Return in about 1 year (around 02/14/2024) for annual well-woman exam.  No future appointments.  Lenice Llamas, [place]

## 2023-02-14 NOTE — Progress Notes (Signed)
Wet mount results reviewed.  The patient was dispensed Metronidazole 500mg  lot # 14 tablets, 2 tablets per day x7 days today. I provided counseling today regarding the medication. We discussed the medication, the side effects and when to call clinic. Patient given the opportunity to ask questions. Questions answered.

## 2023-02-21 ENCOUNTER — Ambulatory Visit: Payer: Medicaid Other

## 2023-02-22 ENCOUNTER — Telehealth: Payer: Self-pay

## 2023-02-22 LAB — IGP, APTIMA HPV
HPV Aptima: POSITIVE — AB
PAP Smear Comment: 0

## 2023-02-22 NOTE — Telephone Encounter (Signed)
Call to pt re pap results from 02/14/23 specimen.  Cytology unsatisfactory Postive HPV  Schedule appt to repeat the pap.  Pap card mailed.

## 2023-02-23 NOTE — Telephone Encounter (Signed)
Phone call to pt at [phone] re pap results from 02/14/23 specimen. Left message re pap and recommendation is to repeat now. Requested call back, Trevion Hoben at [phone].

## 2023-03-08 NOTE — Telephone Encounter (Signed)
Phone call to pt at [phone]. Left detailed message re results, recommendations by provider, and requested return call to please let RN know if she wants to come now for repeat pap or wait a year. (Provider notes seen by pt on 02/26/23 - see lab)

## 2023-03-08 NOTE — Telephone Encounter (Signed)
Pt returned phone call and confirmed identity. Pt requests pap only appt, wants to go ahead and repeat it. Does not need any other services during visit. Appt scheduled for 03/13/23.

## 2023-03-13 ENCOUNTER — Encounter: Payer: Self-pay | Admitting: Family Medicine

## 2023-03-13 ENCOUNTER — Ambulatory Visit (LOCAL_COMMUNITY_HEALTH_CENTER): Payer: Medicaid Other | Admitting: Family Medicine

## 2023-03-13 VITALS — BP 112/70 | HR 87 | Ht 60.0 in | Wt 224.8 lb

## 2023-03-13 DIAGNOSIS — Z309 Encounter for contraceptive management, unspecified: Secondary | ICD-10-CM

## 2023-03-13 DIAGNOSIS — Z1272 Encounter for screening for malignant neoplasm of vagina: Secondary | ICD-10-CM

## 2023-03-13 NOTE — Progress Notes (Signed)
   [facility] Problem Visit  [facility]  Subjective:  Shayann Medlock is a 32 y.o. being seen today for   Chief Complaint  Patient presents with   Gynecologic Exam    Repeat pap    Gynecologic Exam     Repeat pap smear today  Health Maintenance Due  Topic Date Due   COVID-19 Vaccine (1) Never done   Hepatitis C Screening  Never done    ROS  The following portions of the patient's history were reviewed and updated as appropriate: allergies, current medications, past family history, past medical history, past social history, past surgical history and problem list. Problem list updated.   See flowsheet for other program required questions.  Objective:   Vitals:   03/13/23 1620  BP: 112/70  Pulse: 87  Weight: 224 lb 12.8 oz (102 kg)  Height: 5' (1.524 m)    Physical Exam Genitourinary:    General: Normal vulva.     Exam position: Lithotomy position.     Tanner stage (genital): 5.     Labia:        Right: No rash or lesion.        Left: No rash or lesion.      Vagina: Normal.     Cervix: Discharge and friability present.       Assessment and Plan:  Ammie Menzel is a 32 y.o. female presenting to the [facility] Department for a Women's Health problem visit  1. Encounter for follow-up vaginal Papanicolaou smear -repeat pap today -previous sample not accepted in June  - IGP, Aptima HPV     Return in about 1 year (around 03/12/2024) for annual well-woman exam.  No future appointments. Total time spent 10 mins  Lenice Llamas, [place]

## 2023-03-23 LAB — IGP, APTIMA HPV
HPV Aptima: POSITIVE — AB
PAP Smear Comment: 0

## 2023-03-26 ENCOUNTER — Telehealth: Payer: Self-pay

## 2023-03-26 ENCOUNTER — Encounter: Payer: Self-pay | Admitting: Family Medicine

## 2023-03-26 DIAGNOSIS — Z8742 Personal history of other diseases of the female genital tract: Secondary | ICD-10-CM | POA: Insufficient documentation

## 2023-03-26 NOTE — Telephone Encounter (Signed)
Call to pt re pap result from 03/13/23 specimen and colpo referral. ASCUS, Positive HPV  Confirm insurance and where pt wants to go for colpo.

## 2023-03-28 NOTE — Telephone Encounter (Addendum)
Phone call to pt at [phone]. Pt confirmed identity. Counseled pt re pap results and colpo referral.  Pt states she does have [employer], and referral is to be sent to [facility] (they need to confirm they take her insurance before incurring any expenses).

## 2023-03-28 NOTE — Telephone Encounter (Signed)
03/28/23 Colpo referral sent to [facility] through Epic's Referrals tab.

## 2023-04-10 ENCOUNTER — Ambulatory Visit: Payer: Medicaid Other | Admitting: Licensed Clinical Social Worker

## 2023-04-10 DIAGNOSIS — F411 Generalized anxiety disorder: Secondary | ICD-10-CM

## 2023-04-10 DIAGNOSIS — F331 Major depressive disorder, recurrent, moderate: Secondary | ICD-10-CM
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
## 2023-04-11 DIAGNOSIS — F331 Major depressive disorder, recurrent, moderate: Secondary | ICD-10-CM | POA: Insufficient documentation

## 2023-04-11 DIAGNOSIS — F411 Generalized anxiety disorder: Secondary | ICD-10-CM | POA: Insufficient documentation

## 2023-04-17 ENCOUNTER — Ambulatory Visit: Payer: Medicaid Other | Admitting: Licensed Clinical Social Worker

## 2023-04-17 DIAGNOSIS — F411 Generalized anxiety disorder: Secondary | ICD-10-CM

## 2023-04-17 DIAGNOSIS — F331 Major depressive disorder, recurrent, moderate: Secondary | ICD-10-CM

## 2023-04-17 NOTE — Progress Notes (Signed)
Counselor/Therapist Progress Note  Patient ID: Madeline Todd, MRN: [id number],    Date: 04/17/2023  Time Spent: 45 minutes    Treatment Type: Psychotherapy  Reported Symptoms:  "more sensitive", sadness, crying, depressed mood, grief, motivation, sleep disturbance     Mental Status Exam:  Appearance:   Casual     Behavior:  Appropriate, Sharing, and Motivated  Motor:  Normal  Speech/Language:   Clear and Coherent and Normal Rate  Affect:  Appropriate, Congruent, and Full Range  Mood:  normal  Thought process:  normal  Thought content:    WNL  Sensory/Perceptual disturbances:    WNL  Orientation:  oriented to person, place, time/date, situation, and day of week  Attention:  Good  Concentration:  Good  Memory:  WNL  Fund of knowledge:   Good  Insight:    Fair  Judgment:   Fair  Impulse Control:  Fair   Risk Assessment: Danger to Self:  No Self-injurious Behavior: No Danger to Others: No Duty to Warn:no Physical Aggression / Violence:No  Access to Firearms a concern: No  Gang Involvement:No   Subjective: Patient was engaged and cooperative throughout the session using time effectively to discuss thoughts, feelings, goal of treatment and  treatment plan, and sleep hygiene. Patient voices continued motivation for treatment and understanding of CBTs. Patient voices continued motivation for treatment.   Interventions: Cognitive Behavioral Therapy and Client Centered  Established psychological safety. Checked in with patient and reviewed previous session, including assessment. Continued building rapport and understanding of patient's strengths and challenges, charted out patient's presenting challenges using CBTs model, and worked collaboratively to develop goal of treatment and treatment plan. Discussed "sleep hygiene," try to incorporate the following into your daily routine:   Sleep only long enough to feel rested and then get out of bed. Go to bed and get up at the  same time every day. This leads to more regular sleep schedules.  Do not try to force yourself to sleep. If you can't sleep, get out of bed and try again later. Have coffee, tea, and other foods that have caffeine only in the morning, and avoid caffeine after lunch time.  Avoid alcohol in the late afternoon, evening, and bedtime. Avoid smoking, especially in the evening. Nicotine is a stimulant and could keep you awake. Keep your bedroom dark, cool, quiet, and free of reminders of work or other things that cause you stress. Using white noise, earplugs, blackout shades, and eye masks could be useful.  Try to solve problems you have before you go to bed. Exercise several days a week, but not right before bed. Avoid looking at phones or reading devices ("e-books") that give off light before bed. This can make it harder to fall asleep.  Other things that can improve sleep include: Relaxation therapy, in which you focus on relaxing all the muscles in your body 1 by 1 Working with a counselor or psychologist to deal with the problems that might be causing poor sleep  Provided support through active listening, validation of feelings, and highlighted patient's strengths.   Diagnosis:   ICD-10-CM   1. Major depressive disorder, recurrent episode, moderate (HCC)  F33.1     2. Generalized anxiety disorder  F41.1      Plan: Patient's goal is try to figure out how to get through each day without having to feel so sad or so anxious. Doesn't know where she wants to be - difficulty focusing on her goals.   Treatment Target:  Understand the relationship between thoughts, emotions, and behaviors  Psychoeducation on CBTs model   Oriented the client to the therapeutic approach Teach the connection between thoughts, emotions, and behaviors  Treatment Target: Increase realistic balanced thinking -to learn how to replace thinking with thoughts that are more accurate or helpful Explore patient's thoughts,  beliefs, automatic thoughts, assumptions  Identify and replace unhelpful thinking patterns (upsetting ideas, self-talk and mental images) Process distress and allow for emotional release  Questioning and challenging thoughts Cognitive reappraisal  Restructuring, Socratic questioning  Treatment Target: Reducing vulnerability to "emotional mind" Values clarification   Self-care - nutrition, sleep, exercise  Increase positive events  Mindfulness skills Interpersonal Effectiveness  Emotion Regulation Treatment Target: Increased understanding of grief and loss Objectives/treatment focus: Develop vocabulary to describe feelings of grief and loss Identify grief and loss issues  Identify steps toward managing grief Gain awareness, and accept that grief and loss is causing problems  Future Appointments  Date Time Provider Department Center  04/18/2023  3:15 PM Hildred Laser, MD [facility] None  04/24/2023 10:00 AM Kathreen Cosier, LCSW [facility] None    Kathreen Cosier, LCSW

## 2023-04-18 ENCOUNTER — Other Ambulatory Visit (HOSPITAL_COMMUNITY)
Admission: RE | Admit: 2023-04-18 | Discharge: 2023-04-18 | Disposition: A | Discharge status: Home / Self Care | Payer: Medicaid Other | Source: Ambulatory Visit | Attending: Obstetrics and Gynecology | Admitting: Obstetrics and Gynecology

## 2023-04-18 ENCOUNTER — Ambulatory Visit (INDEPENDENT_AMBULATORY_CARE_PROVIDER_SITE_OTHER): Payer: Medicaid Other | Admitting: Obstetrics and Gynecology

## 2023-04-18 ENCOUNTER — Encounter: Payer: Self-pay | Admitting: Obstetrics and Gynecology

## 2023-04-18 VITALS — BP 117/79 | HR 76 | Wt 227.9 lb

## 2023-04-18 DIAGNOSIS — Z23 Encounter for immunization: Secondary | ICD-10-CM | POA: Diagnosis not present

## 2023-04-18 DIAGNOSIS — N898 Other specified noninflammatory disorders of vagina: Secondary | ICD-10-CM | POA: Insufficient documentation

## 2023-04-18 DIAGNOSIS — R87810 Cervical high risk human papillomavirus (HPV) DNA test positive: Secondary | ICD-10-CM | POA: Diagnosis not present

## 2023-04-18 DIAGNOSIS — R87610 Atypical squamous cells of undetermined significance on cytologic smear of cervix (ASC-US): Secondary | ICD-10-CM

## 2023-04-18 DIAGNOSIS — N930 Postcoital and contact bleeding: Secondary | ICD-10-CM

## 2023-04-18 NOTE — Patient Instructions (Addendum)
HPV (Human Papillomavirus) Vaccine: What You Need to Know Many vaccine information statements are available in Spanish and other languages. See PromoAge.com.br. 1. Why get vaccinated? HPV (human papillomavirus) vaccine can prevent infection with some types of human papillomavirus. HPV infections can cause certain types of cancers, including: cervical, vaginal, and vulvar cancers in women penile cancer in men anal cancers in both men and women cancers of tonsils, base of tongue, and back of throat (oropharyngeal cancer) in both men and women HPV infections can also cause anogenital warts. HPV vaccine can prevent over 90% of cancers caused by HPV. HPV is spread through intimate skin-to-skin or sexual contact. HPV infections are so common that nearly all people will get at least one type of HPV at some time in their lives. Most HPV infections go away on their own within 2 years. But sometimes HPV infections will last longer and can cause cancers later in life. 2. HPV vaccine HPV vaccine is routinely recommended for adolescents at 75 or 33 years of age to ensure they are protected before they are exposed to the virus. HPV vaccine may be given beginning at age 38 years and vaccination is recommended for everyone through 33 years of age. HPV vaccine may be given to adults 27 through 33 years of age, based on discussions between the patient and health care provider. Most children who get the first dose before 78 years of age need 2 doses of HPV vaccine. People who get the first dose at or after 36 years of age and younger people with certain immunocompromising conditions need 3 doses. Your health care provider can give you more information. HPV vaccine may be given at the same time as other vaccines. 3. Talk with your health care provider Tell your vaccination provider if the person getting the vaccine: Has had an allergic reaction after a previous dose of HPV vaccine, or has any severe,  life-threatening allergies Is pregnant--HPV vaccine is not recommended until after pregnancy In some cases, your health care provider may decide to postpone HPV vaccination until a future visit. People with minor illnesses, such as a cold, may be vaccinated. People who are moderately or severely ill should usually wait until they recover before getting HPV vaccine. Your health care provider can give you more information. 4. Risks of a vaccine reaction Soreness, redness, or swelling where the shot is given can happen after HPV vaccination. Fever or headache can happen after HPV vaccination. People sometimes faint after medical procedures, including vaccination. Tell your provider if you feel dizzy or have vision changes or ringing in the ears. As with any medicine, there is a very remote chance of a vaccine causing a severe allergic reaction, other serious injury, or death. 5. What if there is a serious problem? An allergic reaction could occur after the vaccinated person leaves the clinic. If you see signs of a severe allergic reaction (hives, swelling of the face and throat, difficulty breathing, a fast heartbeat, dizziness, or weakness), call 9-1-1 and get the person to the nearest hospital. For other signs that concern you, call your health care provider. Adverse reactions should be reported to the Vaccine Adverse Event Reporting System (VAERS). Your health care provider will usually file this report, or you can do it yourself. Visit the VAERS website at www.vaers.LAgents.no or call 862-204-0009. VAERS is only for reporting reactions, and VAERS staff members do not give medical advice. 6. The National Vaccine Injury Compensation Program The [employer] Vaccine Injury Compensation Program (VICP) is a  federal program that was created to compensate people who may have been injured by certain vaccines. Claims regarding alleged injury or death due to vaccination have a time limit for filing, which may be as  short as two years. Visit the VICP website at SpiritualWord.at or call 3198108303 to learn about the program and about filing a claim. 7. How can I learn more? Ask your health care provider. Call your local or state health department. Visit the website of the Food and Drug Administration (FDA) for vaccine package inserts and additional information at FinderList.no. Contact the Centers for Disease Control and Prevention (CDC): Call 330 606 3391 (1-800-CDC-INFO) or Visit CDC's website at PicCapture.uy. Source: CDC Vaccine Information Statement HPV Vaccine (04/16/2020) This same material is available at FootballExhibition.com.br for no charge. This information is not intended to replace advice given to you by your health care provider. Make sure you discuss any questions you have with your health care provider. Document Revised: 12/13/2022 Document Reviewed: 09/18/2022 Elsevier Patient Education  2024 Elsevier Inc. Colposcopy  Colposcopy is a procedure to examine the lowest part of the uterus (cervix) for abnormalities or signs of disease. This procedure is done using an instrument that makes objects appear larger and provides light (colposcope). During the procedure, your health care provider may remove a tissue sample to look at under a microscope (biopsy). A biopsy may be done if any unusual cells are seen during the colposcopy. You may have a colposcopy if you have: An abnormal Pap smear, also called a Pap test. This screening test is used to check for signs of cancer or infection of the vagina, cervix, and uterus. An HPV (human papillomavirus) test and get a positive result for a type of HPV that puts you at high risk of cancer. Certain conditions or symptoms, such as: A sore, or lesion, on your cervix. Genital warts on your vulva, vagina, or cervix. Pain during sex. Vaginal bleeding, especially after sex. A growth on your cervix (cervical  polyp) that needs to be removed. Let your health care provider know about: Any allergies you have, including allergies to medicines, latex, or iodine. All medicines you are taking, including vitamins, herbs, eye drops, creams, and over-the-counter medicines. Any bleeding problems you have. Any surgeries you have had. Any medical conditions you have, such as pelvic inflammatory disease (PID) or an endometrial disorder. The pattern of your menstrual cycles and the form of birth control (contraception) you use, if any. Your medical history, including any cervical treatments and how well you tolerated the procedure (if you have ever fainted). Whether you are pregnant or may be pregnant. What are the risks? Generally, this is a safe procedure. However, problems may occur, including: Infection. Symptoms of infection may include fever, bad-smelling vaginal discharge, or pelvic pain. Vaginal bleeding. Allergic reactions to medicines. Damage to nearby structures or organs. What happens before the procedure? Medicines Ask your health care provider about: Changing or stopping your regular medicines. This is especially important if you are taking diabetes medicines or blood thinners. Taking medicines such as aspirin and ibuprofen. These medicines can thin your blood. Do not take these medicines unless your health care provider tells you to take them. Your health care provider will likely tell you to avoid taking aspirin, or medicine that contains aspirin, for 7 days before the procedure. Taking over-the-counter medicines, vitamins, herbs, and supplements. General instructions Tell your health care provider if you have your menstrual period now or will have it at the time of your procedure. A  colposcopy is not normally done during your menstrual period. If you use contraception, continue to use it before your procedure. For 24 hours before the procedure: Do not use douche products or tampons. Do not  use medicines, creams, or suppositories in the vagina. Do not have sex or insert anything into your vagina. Ask your health care provider what steps will be taken to prevent infection. What happens during the procedure? You will lie down on your back, with your feet in foot rests (stirrups). An instrument called a speculum will be inserted into your vagina. This will be used so your health care provider can see your cervix and the inside of your vagina. A cotton swab will be used to place a small amount of a liquid (solution) on the areas to be examined. This solution makes it easier to see abnormal cells. You may feel a slight burning during this part. The colposcope will be used to scan the cervix with a bright white light. The colposcope will be held near your vulva and will make your vulva, vagina, and cervix look bigger so they can be seen better. If a biopsy is needed: You may be given a medicine to numb the area (local anesthetic). Surgical tools will be used to remove mucus and cells through your vagina. You may feel mild pain while the tissue sample is removed. Bleeding may occur. A solution may be used to stop the bleeding. The tissue removed will be sent to a lab to be looked at under a microscope. The procedure may vary among health care providers and hospitals. What happens after the procedure? You may have some cramping in your abdomen. This should go away after a few minutes. It is up to you to get the results of your procedure. Ask your health care provider, or the department that is doing the procedure, when your results will be ready. Summary Colposcopy is a procedure to examine the lowest part of the uterus (cervix), for signs of disease. A biopsy may be done as part of the procedure. You may have some cramping in your abdomen. This should go away after a few minutes. It is up to you to get the results of your procedure. Ask your health care provider, or the department that  is doing the procedure, when your results will be ready. This information is not intended to replace advice given to you by your health care provider. Make sure you discuss any questions you have with your health care provider. Document Revised: 01/23/2021 Document Reviewed: 01/23/2021 Elsevier Patient Education  2024 [employer].

## 2023-04-18 NOTE — Progress Notes (Signed)
GYNECOLOGY PROGRESS NOTE  Subjective:    Patient ID: Madeline Todd, female    DOB: [birth year], 32 y.o.   MRN: [id number]  HPI  Patient is a 32 y.o. [id number] female who presents for colposcopy for ASCUS HR HPV (no typing) Pap smear performed 03/13/2023.  She was referred from [facility] [place] [facility].   [name] also complains of intermenstrual and postcoital spotting.  She notes that this has been ongoing for several months. Was previously married (but separated) but notes that he passed away in Jan 11, 2023.  She currently has a new partner. States that she mentioned this at her visit with [facility] in June, was also noting a vaginal odor and was treated with antibiotics. Notes symptoms improved for a short time, no longer has odor but still having above mentioned issues.   The following portions of the patient's history were reviewed and updated as appropriate:  She  has a past medical history of Anemia, Obesity affecting pregnancy in second trimester (2016), and Spontaneous abortion.  She  has a past surgical history that includes Cystectomy; Cholecystectomy; and Wisdom tooth extraction.  Her family history includes Asthma in her half-brother; Diabetes in her maternal grandfather and mother; Gestational diabetes in her sister; Heart attack in her paternal grandfather; Hypertension in her mother and sister; Migraines in her mother.  She  reports that she quit smoking about 2 years ago. Her smoking use included cigarettes. She has been exposed to tobacco smoke. She has never used smokeless tobacco. She reports current alcohol use. She reports that she does not use drugs.  She has a current medication list which includes the following prescription(s): nexplanon.  Current Outpatient Medications on File Prior to Visit  Medication Sig Dispense Refill   etonogestrel (NEXPLANON) 68 MG IMPL implant 1 each by Subdermal route once.     No current facility-administered  medications on file prior to visit.   She is allergic to oxycodone and shrimp [shellfish allergy]..  Review of Systems Pertinent items noted in HPI and remainder of comprehensive ROS otherwise negative.   Objective:   Blood pressure 117/79, pulse 76, weight 227 lb 14.4 oz (103.4 kg), last menstrual period 02/16/2023. Body mass index is 44.51 kg/m. General appearance: alert and no distress Abdomen: soft, non-tender; bowel sounds normal; no masses,  no organomegaly Pelvic: deferred   Assessment:   1. ASCUS with positive high risk HPV cervical   2. Need for HPV vaccine   3. Vaginal discharge      Plan:   1. ASCUS with positive high risk HPV cervical - Reviewed ASCCP Guidelines, patient with first abnormal pap smear, low grade, no HPV typing. Recommendations are for f/u pap smear in 1 year.  Risk of progression to CIN III by next year is low, 3.6 %. Reviewed all information with patient who is happy to wait 1 year for retesting. Would recommend HPV typing with next pap smear.   2. Need for HPV vaccine - Discussed role for HPV in cervical dysplasia, need for surveillance. Discussed HPV vaccine with patient.  Ok to receive today. First dose administered.  - HPV 9-valent vaccine,Recombinat  3. Postcoital bleeding - Patient notes postcoital and intermenstrual bleeding for several months. Also recently treated with prophylactic antibiotics for likely suspected BV infection, however had no testing performed  - Cervicovaginal ancillary only     A total of 30 minutes were spent face-to-face with the patient during the encounter with greater than 50% dealing with counseling and coordination  of care.   Hildred Laser, MD [facility]

## 2023-04-20 ENCOUNTER — Other Ambulatory Visit: Payer: Self-pay | Admitting: Obstetrics and Gynecology

## 2023-04-20 DIAGNOSIS — B9689 Other specified bacterial agents as the cause of diseases classified elsewhere: Secondary | ICD-10-CM

## 2023-04-20 MED ORDER — METRONIDAZOLE 500 MG PO TABS: 500.0000 mg | ORAL_TABLET | Freq: Two times a day (BID) | ORAL | 0 refills | Status: AC

## 2023-04-24 ENCOUNTER — Ambulatory Visit: Payer: Medicaid Other | Admitting: Licensed Clinical Social Worker

## 2023-05-01 ENCOUNTER — Ambulatory Visit: Payer: Medicaid Other | Admitting: Licensed Clinical Social Worker

## 2023-05-01 DIAGNOSIS — F411 Generalized anxiety disorder: Secondary | ICD-10-CM

## 2023-05-01 DIAGNOSIS — F331 Major depressive disorder, recurrent, moderate: Secondary | ICD-10-CM

## 2023-05-01 NOTE — Progress Notes (Signed)
Counselor/Therapist Progress Note  Patient ID: Madeline Todd, MRN: [id number],    Date: 05/01/2023  Time Spent: 46 minutes    Treatment Type: Psychotherapy  Reported Symptoms:  Some improvement in symptoms, mild increase in motivation, numbness, continued low mood, sadness   Mental Status Exam:  Appearance:   Casual     Behavior:  Appropriate, Sharing, and Motivated  Motor:  Normal  Speech/Language:   Clear and Coherent and Normal Rate  Affect:  Appropriate, Congruent, and Full Range  Mood:  dysthymic  Thought process:  normal  Thought content:    WNL  Sensory/Perceptual disturbances:    WNL  Orientation:  oriented to person, place, time/date, and situation  Attention:  Good  Concentration:  Good  Memory:  WNL  Fund of knowledge:   Good  Insight:    Fair  Judgment:   Fair  Impulse Control:  Fair   Risk Assessment: Danger to Self:  No Self-injurious Behavior: No Danger to Others: No Duty to Warn:no Physical Aggression / Violence:No  Access to Firearms a concern: No  Gang Involvement:No   Subjective:   Patient was receptive to feedback and intervention from LCSW and actively and effectively participated throughout the session. She reports she is now on medication and reports that its helping with crying spells, able to get out more and get more accomplished and sleep improvement. She reports feeling "nonchalant". Currently prescribed duloxetine and  buspirone. Patient is likely to benefit from future treatment because she remains motivated to decrease symptoms and is receptive to feedback and intervention.   Interventions: Cognitive Behavioral Therapy and client centered Established psychological safety. Clinician met with patient to identify needs related to stressors and functioning, and assess and monitor for signs and symptoms of depressed and anxiety, and assess safety. The clinician processed with the patient how they have been doing since the last follow-up  session. LCSW assisted patient in processing their emotions about what they have experienced related to grief and parenting children with grief. LCSW normalized and validated patient's experiences and reviewed noticing and observing emotions and providing support to her children through validation, encouraged patient to journal and taught radical acceptance. Provided support through active listening, validation of feelings, and highlighted patient's strengths.   Diagnosis:   ICD-10-CM   1. Major depressive disorder, recurrent episode, moderate (HCC)  F33.1     2. Generalized anxiety disorder  F41.1      Plan: Patient's goal is try to figure out how to get through each day without having to feel so sad or so anxious. Doesn't know where she wants to be - difficulty focusing on her goals.    Treatment Target: Understand the relationship between thoughts, emotions, and behaviors  Psychoeducation on CBTs model   Oriented the client to the therapeutic approach Teach the connection between thoughts, emotions, and behaviors  Treatment Target: Increase realistic balanced thinking -to learn how to replace thinking with thoughts that are more accurate or helpful Explore patient's thoughts, beliefs, automatic thoughts, assumptions  Identify and replace unhelpful thinking patterns (upsetting ideas, self-talk and mental images) Process distress and allow for emotional release  Questioning and challenging thoughts Cognitive reappraisal  Restructuring, Socratic questioning  Treatment Target: Reducing vulnerability to "emotional mind" Values clarification   Self-care - nutrition, sleep, exercise  Increase positive events  Mindfulness skills Interpersonal Effectiveness  Emotion Regulation Treatment Target: Increased understanding of grief and loss Objectives/treatment focus: Develop vocabulary to describe feelings of grief and loss Identify grief and loss issues  Identify steps toward managing  grief Gain awareness, and accept that grief and loss is causing problems  Future Appointments  Date Time Provider Department Center  05/15/2023 10:00 AM Kathreen Cosier, LCSW [facility] None  06/18/2023  3:15 PM [facility] None    Kathreen Cosier, LCSW

## 2023-05-15 ENCOUNTER — Ambulatory Visit: Payer: Medicaid Other | Admitting: Licensed Clinical Social Worker

## 2023-05-16 NOTE — Progress Notes (Signed)
Pt presented to [facility] on 04/18/23 due to colpo referral.  Pt seen by Hildred Laser, MD.  Colpo not performed.  Copied and pasted from Dr. Oretha Milch 04/18/23 clinical notes: Plan:  1. ASCUS with positive high risk HPV cervical - Reviewed ASCCP Guidelines, patient with first abnormal pap smear, low grade, no HPV typing. Recommendations are for f/u pap smear in 1 year.  Risk of progression to CIN III by next year is low, 3.6 %. Reviewed all information with patient who is happy to wait 1 year for retesting. Would recommend HPV typing with next pap smear.  (In addition, HPV vaccine administered.)

## 2023-05-29 ENCOUNTER — Ambulatory Visit: Payer: Medicaid Other | Admitting: Licensed Clinical Social Worker

## 2023-05-29 NOTE — Progress Notes (Unsigned)
Counselor/Therapist Progress Note  Patient ID: Madeline Todd, MRN: [id number],    Date: 05/29/2023  Time Spent: ***   Treatment Type: Psychotherapy  Reported Symptoms: {CHL AMB Reported [phone]}  Mental Status Exam:  Appearance:   {PSY:22683}     Behavior:  {PSY:21022743}  Motor:  {PSY:22302}  Speech/Language:   {PSY:22685}  Affect:  {PSY:22687}  Mood:  {PSY:31886}  Thought process:  {PSY:31888}  Thought content:    {PSY:802-206-0236}  Sensory/Perceptual disturbances:    {PSY:815-094-9516}  Orientation:  {PSY:30297}  Attention:  {PSY:22877}  Concentration:  {PSY:401-742-7386}  Memory:  {PSY:802-619-2596}  Fund of knowledge:   {PSY:401-742-7386}  Insight:    {PSY:401-742-7386}  Judgment:   {PSY:401-742-7386}  Impulse Control:  {PSY:401-742-7386}   Risk Assessment: Danger to Self:  {PSY:22692} Self-injurious Behavior: {PSY:22692} Danger to Others: {PSY:22692} Duty to Warn:{PSY:311194} Physical Aggression / Violence:{PSY:21197} Access to Firearms a concern: {PSY:21197} Gang Involvement:{PSY:21197}  Subjective: ***   Interventions: {[phone]}  Diagnosis:No diagnosis found.  Plan: ***  Interpreter used: {ACHD Interpreters:210350009}   Kathreen Cosier, LCSW

## 2023-05-31 NOTE — Progress Notes (Signed)
NO show

## 2023-06-18 ENCOUNTER — Ambulatory Visit: Payer: Medicaid Other

## 2023-06-18 NOTE — Progress Notes (Deleted)
    NURSE VISIT NOTE  Subjective:    Patient ID: Madeline Todd, female    DOB: 1990/10/24, 32 y.o.   MRN: [id number]  HPI  Patient is a 32 y.o. [id number] female Single female who presents for her second Gardasil injection. Order to administer given by Hildred Laser, MD on 04/18/23.   Objective:    There were no vitals taken for this visit.  32 y.o. LMP:  ***  Contraception:  {CCO Contraception:21020264} Given by: {AOB Clinical [id number] Site:  {left/right:311354} deltoid  Lab Review  @THIS  VISIT ONLY@  Assessment:   No diagnosis found.   Plan:   Patient will return in 4 months for third injection.    Loney Laurence, CMA

## 2023-11-26 NOTE — Patient Instructions (Signed)
 HPV (Human Papillomavirus) Vaccine: What You Need to Know Many vaccine information statements are available in Spanish and other languages. See PromoAge.com.br. 1. Why get vaccinated? HPV (human papillomavirus) vaccine can prevent infection with some types of human papillomavirus. HPV infections can cause certain types of cancers, including: cervical, vaginal, and vulvar cancers in women penile cancer in men anal cancers in both men and women cancers of tonsils, base of tongue, and back of throat (oropharyngeal cancer) in both men and women HPV infections can also cause anogenital warts. HPV vaccine can prevent over 90% of cancers caused by HPV. HPV is spread through intimate skin-to-skin or sexual contact. HPV infections are so common that nearly all people will get at least one type of HPV at some time in their lives. Most HPV infections go away on their own within 2 years. But sometimes HPV infections will last longer and can cause cancers later in life. 2. HPV vaccine HPV vaccine is routinely recommended for adolescents at 9 or 34 years of age to ensure they are protected before they are exposed to the virus. HPV vaccine may be given beginning at age 66 years and vaccination is recommended for everyone through 34 years of age. HPV vaccine may be given to adults 27 through 34 years of age, based on discussions between the patient and health care provider. Most children who get the first dose before 69 years of age need 2 doses of HPV vaccine. People who get the first dose at or after 52 years of age and younger people with certain immunocompromising conditions need 3 doses. Your health care provider can give you more information. HPV vaccine may be given at the same time as other vaccines. 3. Talk with your health care provider Tell your vaccination provider if the person getting the vaccine: Has had an allergic reaction after a previous dose of HPV vaccine, or has any severe,  life-threatening allergies Is pregnant--HPV vaccine is not recommended until after pregnancy In some cases, your health care provider may decide to postpone HPV vaccination until a future visit. People with minor illnesses, such as a cold, may be vaccinated. People who are moderately or severely ill should usually wait until they recover before getting HPV vaccine. Your health care provider can give you more information. 4. Risks of a vaccine reaction Soreness, redness, or swelling where the shot is given can happen after HPV vaccination. Fever or headache can happen after HPV vaccination. People sometimes faint after medical procedures, including vaccination. Tell your provider if you feel dizzy or have vision changes or ringing in the ears. As with any medicine, there is a very remote chance of a vaccine causing a severe allergic reaction, other serious injury, or death. 5. What if there is a serious problem? An allergic reaction could occur after the vaccinated person leaves the clinic. If you see signs of a severe allergic reaction (hives, swelling of the face and throat, difficulty breathing, a fast heartbeat, dizziness, or weakness), call 9-1-1 and get the person to the nearest hospital. For other signs that concern you, call your health care provider. Adverse reactions should be reported to the Vaccine Adverse Event Reporting System (VAERS). Your health care provider will usually file this report, or you can do it yourself. Visit the VAERS website at www.vaers.LAgents.no or call (769)345-5413. VAERS is only for reporting reactions, and VAERS staff members do not give medical advice. 6. The National Vaccine Injury Compensation Program The [employer] Vaccine Injury Compensation Program (VICP) is a  federal program that was created to compensate people who may have been injured by certain vaccines. Claims regarding alleged injury or death due to vaccination have a time limit for filing, which may be as  short as two years. Visit the VICP website at SpiritualWord.at or call 905-576-8182 to learn about the program and about filing a claim. 7. How can I learn more? Ask your health care provider. Call your local or state health department. Visit the website of the Food and Drug Administration (FDA) for vaccine package inserts and additional information at FinderList.no. Contact the Centers for Disease Control and Prevention (CDC): Call 636 880 9869 (1-800-CDC-INFO) or Visit CDC's website at PicCapture.uy. Source: CDC Vaccine Information Statement HPV Vaccine (04/16/2020) This same material is available at FootballExhibition.com.br for no charge. This information is not intended to replace advice given to you by your health care provider. Make sure you discuss any questions you have with your health care provider. Document Revised: 12/13/2022 Document Reviewed: 09/18/2022 Elsevier Patient Education  2024 Elsevier Inc.  Please follow this link to schedule an appointment to establish care with a PCP: [email or web address]

## 2023-11-27 ENCOUNTER — Ambulatory Visit

## 2023-11-28 ENCOUNTER — Ambulatory Visit

## 2023-11-28 VITALS — BP 120/76 | HR 76 | Ht 61.0 in | Wt 231.4 lb

## 2023-11-28 DIAGNOSIS — Z23 Encounter for immunization: Secondary | ICD-10-CM | POA: Diagnosis not present

## 2023-11-28 DIAGNOSIS — Z3202 Encounter for pregnancy test, result negative: Secondary | ICD-10-CM | POA: Diagnosis not present

## 2023-11-28 DIAGNOSIS — N912 Amenorrhea, unspecified: Secondary | ICD-10-CM

## 2023-11-28 DIAGNOSIS — Z8742 Personal history of other diseases of the female genital tract: Secondary | ICD-10-CM

## 2023-11-28 LAB — POCT URINE PREGNANCY: Preg Test, Ur: NEGATIVE

## 2023-11-28 NOTE — Progress Notes (Signed)
    NURSE VISIT NOTE  Subjective:    Patient ID: Madeline Todd, female    DOB: Jan 30, 1991, 33 y.o.   MRN: [id number]  HPI  Patient is a 33 y.o. [id number] female Single Hispanic female who presents for her second Gardasil injection. Order to administer given by Hildred Laser, MD on 04/18/2023.   Objective:    BP 120/76   Pulse 76   Ht 5\' 1"  (1.549 m)   Wt 231 lb 6.4 oz (105 kg)   BMI 43.72 kg/m   33 y.o. LMP:  10/28/2023  Contraception:  *Nexplanon Given by: Sheliah Hatch, CMA Site:  left deltoid  Lab Review  Results for orders placed or performed in visit on 11/28/23  POCT urine pregnancy  Result Value Ref Range   Preg Test, Ur Negative Negative      Assessment:   1. Need for HPV vaccine   2. History of abnormal cervical Pap smear   3. Absence of menstruation      Plan:   Patient will return in 4 months for third injection.    Fonda Kinder, CMA

## 2023-12-09 ENCOUNTER — Emergency Department

## 2023-12-09 ENCOUNTER — Other Ambulatory Visit: Payer: Self-pay

## 2023-12-09 ENCOUNTER — Emergency Department
Admission: EM | Admit: 2023-12-09 | Discharge: 2023-12-09 | Disposition: A | Discharge status: Home / Self Care | Attending: Emergency Medicine | Admitting: Emergency Medicine

## 2023-12-09 ENCOUNTER — Encounter: Payer: Self-pay | Admitting: Emergency Medicine

## 2023-12-09 DIAGNOSIS — S0011XA Contusion of right eyelid and periocular area, initial encounter: Secondary | ICD-10-CM | POA: Diagnosis not present

## 2023-12-09 DIAGNOSIS — Y9289 Other specified places as the place of occurrence of the external cause: Secondary | ICD-10-CM | POA: Insufficient documentation

## 2023-12-09 DIAGNOSIS — R0781 Pleurodynia: Secondary | ICD-10-CM

## 2023-12-09 DIAGNOSIS — M25572 Pain in left ankle and joints of left foot: Secondary | ICD-10-CM

## 2023-12-09 DIAGNOSIS — H05231 Hemorrhage of right orbit: Secondary | ICD-10-CM

## 2023-12-09 DIAGNOSIS — S0083XA Contusion of other part of head, initial encounter: Secondary | ICD-10-CM | POA: Diagnosis not present

## 2023-12-09 DIAGNOSIS — H5711 Ocular pain, right eye: Secondary | ICD-10-CM | POA: Diagnosis present

## 2023-12-09 DIAGNOSIS — T1490XA Injury, unspecified, initial encounter: Secondary | ICD-10-CM

## 2023-12-09 LAB — POC URINE PREG, ED: Preg Test, Ur: NEGATIVE

## 2023-12-09 MED ORDER — ONDANSETRON 4 MG PO TBDP
4.0000 mg | ORAL_TABLET | Freq: Once | ORAL | Status: AC
  Administered 2023-12-09: 4 mg via ORAL
  Filled 2023-12-09: qty 1

## 2023-12-09 MED ORDER — FLUORESCEIN SODIUM 1 MG OP STRP
1.0000 | ORAL_STRIP | Freq: Once | OPHTHALMIC | Status: AC
  Administered 2023-12-09: 1 via OPHTHALMIC
  Filled 2023-12-09: qty 1

## 2023-12-09 MED ORDER — KETOROLAC TROMETHAMINE 60 MG/2ML IM SOLN
30.0000 mg | Freq: Once | INTRAMUSCULAR | Status: AC
  Administered 2023-12-09: 30 mg via INTRAMUSCULAR
  Filled 2023-12-09: qty 2

## 2023-12-09 MED ORDER — OXYCODONE HCL 5 MG PO TABS: 5.0000 mg | ORAL_TABLET | Freq: Three times a day (TID) | ORAL | 0 refills | Status: AC | PRN

## 2023-12-09 MED ORDER — ONDANSETRON HCL 4 MG PO TABS: 4.0000 mg | ORAL_TABLET | Freq: Three times a day (TID) | ORAL | 0 refills | Status: AC | PRN

## 2023-12-09 MED ORDER — TETRACAINE HCL 0.5 % OP SOLN
2.0000 [drp] | Freq: Once | OPHTHALMIC | Status: AC
  Administered 2023-12-09: 2 [drp] via OPHTHALMIC
  Filled 2023-12-09: qty 4
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
## 2023-12-09 NOTE — Discharge Instructions (Signed)
 I have sent pain and nausea medication to your pharmacy to take as needed help with symptoms.  You can also use ibuprofen and Tylenol as well.  Please follow-up your primary care provider for reevaluation as needed.  If you have any significant worsening of the symptoms or any new symptoms, return to the ED for further evaluation.  CT imaging of your head, neck, and face showed no acute injuries and x-rays of your ribs and left ankle showed no injuries.
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
## 2023-12-10 NOTE — Progress Notes (Deleted)
    GYNECOLOGY PROGRESS NOTE  Subjective:    Patient ID: Emonee Winkowski, female    DOB: 30-Sep-1990, 33 y.o.   MRN: [id number]  HPI  Patient is a 33 y.o. [id number] female who presents for evaluation of abnormal uterine bleeding.  {Common ambulatory SmartLinks:19316}  Review of Systems {ros; complete:30496}   Objective:   Last menstrual period 10/28/2023. There is no height or weight on file to calculate BMI. General appearance: {general exam:16600} Abdomen: {abdominal exam:16834} Pelvic: {pelvic exam:16852::"cervix normal in appearance","external genitalia normal","no adnexal masses or tenderness","no cervical motion tenderness","rectovaginal septum normal","uterus normal size, shape, and consistency","vagina normal without discharge"} Extremities: {extremity exam:5109} Neurologic: {neuro exam:17854}   Assessment:   No diagnosis found.   Plan:   There are no diagnoses linked to this encounter.     Hildred Laser, MD [facility]

## 2023-12-11 ENCOUNTER — Ambulatory Visit: Admitting: Obstetrics and Gynecology

## 2023-12-18 ENCOUNTER — Encounter: Payer: Self-pay | Admitting: Obstetrics and Gynecology

## 2024-01-22 ENCOUNTER — Ambulatory Visit
Admission: EM | Admit: 2024-01-22 | Discharge: 2024-01-22 | Disposition: A | Discharge status: Home / Self Care | Attending: Emergency Medicine | Admitting: Emergency Medicine

## 2024-01-22 DIAGNOSIS — R21 Rash and other nonspecific skin eruption: Secondary | ICD-10-CM

## 2024-01-22 MED ORDER — CETIRIZINE HCL 10 MG PO TABS: 10.0000 mg | ORAL_TABLET | Freq: Every day | ORAL | 0 refills | Status: AC

## 2024-01-22 MED ORDER — PREDNISONE 10 MG (21) PO TBPK: ORAL_TABLET | Freq: Every day | ORAL | 0 refills | Status: AC

## 2024-01-22 MED ORDER — DEXAMETHASONE SODIUM PHOSPHATE 10 MG/ML IJ SOLN
10.0000 mg | Freq: Once | INTRAMUSCULAR | Status: AC
  Administered 2024-01-22: 10 mg via INTRAMUSCULAR

## 2024-01-22 NOTE — Discharge Instructions (Addendum)
 You were given an injection of a steroid called dexamethasone.  Start the prednisone  taper tomorrow as directed.    Take Zyrtec as directed.    Follow-up with a primary care provider if your symptoms are not improving.

## 2024-01-22 NOTE — ED Provider Notes (Signed)
 [facility]    CSN: [id number] Arrival date & time: 01/22/24  1107      History   Chief Complaint Chief Complaint  Patient presents with   Rash    HPI Madeline Todd is a 33 y.o. female.  Patient presents with a pruritic rash x 2 days.  The rash started on her legs.  She used a new lotion on her arms and legs but the rash was present prior to this.  The rash spread to her trunk and face yesterday.  She took 1 tablet of Benadryl  last night.  No OTC medications taken today.  No fever, sore throat, difficulty swallowing, shortness of breath.  The history is provided by the patient and medical records.    Past Medical History:  Diagnosis Date   Anemia    Obesity affecting pregnancy in second trimester 2016   Spontaneous abortion    x2    Patient Active Problem List   Diagnosis Date Noted   Major depressive disorder, recurrent episode, moderate (HCC) 04/11/2023   Generalized anxiety disorder 04/11/2023   History of abnormal cervical Pap smear 03/26/2023   BMI 45.0-49.9, adult (HCC) 03/24/2021    Past Surgical History:  Procedure Laterality Date   CHOLECYSTECTOMY     CYSTECTOMY     vaginal cyst   WISDOM TOOTH EXTRACTION     x4    OB History     Gravida  5   Para  3   Term  3   Preterm      AB  2   Living  3      SAB  2   IAB      Ectopic      Multiple  0   Live Births  3            Home Medications    Prior to Admission medications   Medication Sig Start Date End Date Taking? Authorizing Provider  cetirizine (ZYRTEC ALLERGY) 10 MG tablet Take 1 tablet (10 mg total) by mouth daily for 14 days. 01/22/24 02/05/24 Yes Wellington Half, NP  metroNIDAZOLE  (FLAGYL ) 500 MG tablet Take 1 tablet (500 mg total) by mouth 2 (two) times daily. Patient not taking: Reported on 01/22/2024 04/20/23   Teresa Fender, MD  predniSONE  (STERAPRED UNI-PAK 21 TAB) 10 MG (21) TBPK tablet Take by mouth daily. As directed 01/23/24  Yes Wellington Half, NP   etonogestrel  (NEXPLANON ) 68 MG IMPL implant 1 each by Subdermal route once.    [provider]  ondansetron  (ZOFRAN ) 4 MG tablet Take 1 tablet (4 mg total) by mouth every 8 (eight) hours as needed. Patient not taking: Reported on 01/22/2024 12/09/23 12/08/24  Kandee Orion, MD  oxyCODONE  (ROXICODONE ) 5 MG immediate release tablet Take 1 tablet (5 mg total) by mouth every 8 (eight) hours as needed. Patient not taking: Reported on 01/22/2024 12/09/23 12/08/24  Kandee Orion, MD    Family History Family History  Problem Relation Age of Onset   Heart attack Paternal Grandfather    Diabetes Maternal Grandfather    Hypertension Mother    Diabetes Mother    Migraines Mother    Asthma Half-Brother    Hypertension Sister    Gestational diabetes Sister     Social History Social History   Tobacco Use   Smoking status: Former    Current packs/day: 0.00    Types: Cigarettes    Quit date: 01/23/2021    Years since quitting:  2.9    Passive exposure: Current   Smokeless tobacco: Never  Vaping Use   Vaping status: Former   Quit date: 01/23/2021   Devices: Only vapes when drinking and last ETOH use 01/22/2021.  Substance Use Topics   Alcohol use: Yes    Comment: Last ETOH use 01/23/2021.   Drug use: No    Types: Marijuana    Comment: Last marijuana "years ago"     Allergies   Oxycodone  and Shrimp [shellfish allergy]   Review of Systems Review of Systems  Constitutional:  Negative for chills and fever.  HENT:  Negative for sore throat, trouble swallowing and voice change.   Respiratory:  Negative for cough and shortness of breath.   Skin:  Positive for color change and rash.     Physical Exam Triage Vital Signs ED Triage Vitals  Encounter Vitals Group     BP 01/22/24 1122 127/68     Systolic BP Percentile --      Diastolic BP Percentile --      Pulse Rate 01/22/24 1122 74     Resp 01/22/24 1122 16     Temp 01/22/24 1122 (!) 97.3 F (36.3 C)     Temp src --       SpO2 01/22/24 1122 98 %     Weight --      Height --      Head Circumference --      Peak Flow --      Pain Score 01/22/24 1118 3     Pain Loc --      Pain Education --      Exclude from Growth Chart --    No data found.  Updated Vital Signs BP 127/68   Pulse 74   Temp (!) 97.3 F (36.3 C)   Resp 16   SpO2 98%   Visual Acuity Right Eye Distance:   Left Eye Distance:   Bilateral Distance:    Right Eye Near:   Left Eye Near:    Bilateral Near:     Physical Exam Constitutional:      General: She is not in acute distress. HENT:     Mouth/Throat:     Mouth: Mucous membranes are moist.     Pharynx: Oropharynx is clear.  Cardiovascular:     Rate and Rhythm: Normal rate and regular rhythm.     Heart sounds: Normal heart sounds.  Pulmonary:     Effort: Pulmonary effort is normal. No respiratory distress.     Breath sounds: Normal breath sounds.  Skin:    General: Skin is warm and dry.     Findings: Rash present.     Comments: Erythematous maculopapular rash on face, trunk, extremities.  Neurological:     Mental Status: She is alert.      UC Treatments / Results  Labs (all labs ordered are listed, but only abnormal results are displayed) Labs Reviewed - No data to display  EKG   Radiology No results found.  Procedures Procedures (including critical care time)  Medications Ordered in UC Medications  dexamethasone (DECADRON) injection 10 mg (has no administration in time range)    Initial Impression / Assessment and Plan / UC Course  I have reviewed the triage vital signs and the nursing notes.  Pertinent labs & imaging results that were available during my care of the patient were reviewed by me and considered in my medical decision making (see chart for details).    Rash.  Afebrile and vital signs are stable.  No difficulty swallowing or breathing.  Dexamethasone given here.  Starting prednisone  taper tomorrow.  Instructed patient to take Zyrtec  daily x 14 days.  Education provided on rash.  Instructed patient to follow up with her PCP if her symptoms are not improving.  She agrees to plan of care.   Final Clinical Impressions(s) / UC Diagnoses   Final diagnoses:  Rash     Discharge Instructions      You were given an injection of a steroid called dexamethasone.  Start the prednisone  taper tomorrow as directed.    Take Zyrtec as directed.    Follow-up with a primary care provider if your symptoms are not improving.    ED Prescriptions     Medication Sig Dispense Auth. Provider   predniSONE  (STERAPRED UNI-PAK 21 TAB) 10 MG (21) TBPK tablet Take by mouth daily. As directed 21 tablet Wellington Half, NP   cetirizine (ZYRTEC ALLERGY) 10 MG tablet Take 1 tablet (10 mg total) by mouth daily for 14 days. 14 tablet Wellington Half, NP      PDMP not reviewed this encounter.   Wellington Half, NP 01/22/24 1141

## 2024-01-22 NOTE — ED Triage Notes (Signed)
 Patient to Urgent Care with complaints of an itchy rash. Rash present to face/ body/ arms  Symptoms started yesterday. Used a new lotion.   Meds: Benadryl 

## 2024-03-28 ENCOUNTER — Ambulatory Visit

## 2024-04-02 ENCOUNTER — Ambulatory Visit

## 2024-04-02 VITALS — BP 125/79 | HR 72 | Ht 61.0 in | Wt 230.3 lb

## 2024-04-02 DIAGNOSIS — Z23 Encounter for immunization: Secondary | ICD-10-CM

## 2024-04-02 NOTE — Progress Notes (Signed)
    NURSE VISIT NOTE  Subjective:    Patient ID: Madeline Todd, female    DOB: 1990/10/14, 33 y.o.   MRN: [id number]  HPI  Patient is a 33 y.o. [id number] female Single Hispanic female who presents for her third Gardasil injection. Order to administer given by Archie Savers, MD on 04/18/2023.   Objective:    BP 125/79   Pulse 72   Ht 5' 1 (1.549 m)   Wt 230 lb 4.8 oz (104.5 kg)   LMP 03/21/2024   BMI 43.51 kg/m   33 y.o. LMP:  03/21/2024  Contraception:  *Nexplanon  Given by: Rollo Maxin, CMA Site:  right deltoid  Lab Review  No results found for any visits on 04/02/24.    Assessment:   1. Need for HPV vaccine      Plan:   Patient will return in prn. Gardasil series complete.    Rollo JINNY Maxin, CMA

## 2024-04-02 NOTE — Patient Instructions (Signed)
 HPV (Human Papillomavirus) Vaccine: What You Need to Know Many vaccine information statements are available in Spanish and other languages. See PromoAge.com.br. 1. Why get vaccinated? HPV (human papillomavirus) vaccine can prevent infection with some types of human papillomavirus. HPV infections can cause certain types of cancers, including: cervical, vaginal, and vulvar cancers in women penile cancer in men anal cancers in both men and women cancers of tonsils, base of tongue, and back of throat (oropharyngeal cancer) in both men and women HPV infections can also cause anogenital warts. HPV vaccine can prevent over 90% of cancers caused by HPV. HPV is spread through intimate skin-to-skin or sexual contact. HPV infections are so common that nearly all people will get at least one type of HPV at some time in their lives. Most HPV infections go away on their own within 2 years. But sometimes HPV infections will last longer and can cause cancers later in life. 2. HPV vaccine HPV vaccine is routinely recommended for adolescents at 9 or 34 years of age to ensure they are protected before they are exposed to the virus. HPV vaccine may be given beginning at age 66 years and vaccination is recommended for everyone through 34 years of age. HPV vaccine may be given to adults 27 through 34 years of age, based on discussions between the patient and health care provider. Most children who get the first dose before 69 years of age need 2 doses of HPV vaccine. People who get the first dose at or after 52 years of age and younger people with certain immunocompromising conditions need 3 doses. Your health care provider can give you more information. HPV vaccine may be given at the same time as other vaccines. 3. Talk with your health care provider Tell your vaccination provider if the person getting the vaccine: Has had an allergic reaction after a previous dose of HPV vaccine, or has any severe,  life-threatening allergies Is pregnant--HPV vaccine is not recommended until after pregnancy In some cases, your health care provider may decide to postpone HPV vaccination until a future visit. People with minor illnesses, such as a cold, may be vaccinated. People who are moderately or severely ill should usually wait until they recover before getting HPV vaccine. Your health care provider can give you more information. 4. Risks of a vaccine reaction Soreness, redness, or swelling where the shot is given can happen after HPV vaccination. Fever or headache can happen after HPV vaccination. People sometimes faint after medical procedures, including vaccination. Tell your provider if you feel dizzy or have vision changes or ringing in the ears. As with any medicine, there is a very remote chance of a vaccine causing a severe allergic reaction, other serious injury, or death. 5. What if there is a serious problem? An allergic reaction could occur after the vaccinated person leaves the clinic. If you see signs of a severe allergic reaction (hives, swelling of the face and throat, difficulty breathing, a fast heartbeat, dizziness, or weakness), call 9-1-1 and get the person to the nearest hospital. For other signs that concern you, call your health care provider. Adverse reactions should be reported to the Vaccine Adverse Event Reporting System (VAERS). Your health care provider will usually file this report, or you can do it yourself. Visit the VAERS website at www.vaers.LAgents.no or call (769)345-5413. VAERS is only for reporting reactions, and VAERS staff members do not give medical advice. 6. The National Vaccine Injury Compensation Program The [employer] Vaccine Injury Compensation Program (VICP) is a  federal program that was created to compensate people who may have been injured by certain vaccines. Claims regarding alleged injury or death due to vaccination have a time limit for filing, which may be as  short as two years. Visit the VICP website at SpiritualWord.at or call 905-576-8182 to learn about the program and about filing a claim. 7. How can I learn more? Ask your health care provider. Call your local or state health department. Visit the website of the Food and Drug Administration (FDA) for vaccine package inserts and additional information at FinderList.no. Contact the Centers for Disease Control and Prevention (CDC): Call 636 880 9869 (1-800-CDC-INFO) or Visit CDC's website at PicCapture.uy. Source: CDC Vaccine Information Statement HPV Vaccine (04/16/2020) This same material is available at FootballExhibition.com.br for no charge. This information is not intended to replace advice given to you by your health care provider. Make sure you discuss any questions you have with your health care provider. Document Revised: 12/13/2022 Document Reviewed: 09/18/2022 Elsevier Patient Education  2024 Elsevier Inc.  Please follow this link to schedule an appointment to establish care with a PCP: [email or web address]

## 2024-04-10 ENCOUNTER — Other Ambulatory Visit: Payer: Self-pay | Admitting: Family

## 2024-04-10 DIAGNOSIS — N921 Excessive and frequent menstruation with irregular cycle: Secondary | ICD-10-CM

## 2024-04-10 DIAGNOSIS — N926 Irregular menstruation, unspecified: Secondary | ICD-10-CM

## 2024-04-16 ENCOUNTER — Ambulatory Visit: Admission: RE | Admit: 2024-04-16 | Source: Ambulatory Visit

## 2024-04-24 ENCOUNTER — Ambulatory Visit
Admission: RE | Admit: 2024-04-24 | Discharge: 2024-04-24 | Disposition: A | Discharge status: Home / Self Care | Source: Ambulatory Visit | Attending: Family | Admitting: Family

## 2024-04-24 DIAGNOSIS — N921 Excessive and frequent menstruation with irregular cycle: Secondary | ICD-10-CM | POA: Insufficient documentation

## 2024-04-24 DIAGNOSIS — N926 Irregular menstruation, unspecified: Secondary | ICD-10-CM | POA: Insufficient documentation
